# Patient Record
Sex: Female | Born: 1985 | Race: Black or African American | Hispanic: No | Marital: Single | Smoking: Current every day smoker
Health system: Southern US, Community
[De-identification: ages and names within clinical notes are randomized; demographics above are authoritative.]

## PROBLEM LIST (undated history)

## (undated) DIAGNOSIS — R569 Unspecified convulsions: Secondary | ICD-10-CM

## (undated) HISTORY — DX: Unspecified convulsions: R56.9

---

## 2007-08-08 ENCOUNTER — Emergency Department: Payer: Self-pay | Admitting: Emergency Medicine

## 2008-01-03 ENCOUNTER — Emergency Department: Payer: Self-pay | Admitting: Emergency Medicine

## 2009-06-03 ENCOUNTER — Emergency Department: Payer: Self-pay | Admitting: Emergency Medicine

## 2014-08-07 ENCOUNTER — Emergency Department: Payer: Self-pay | Admitting: Emergency Medicine

## 2015-05-07 ENCOUNTER — Emergency Department
Admission: EM | Admit: 2015-05-07 | Discharge: 2015-05-07 | Disposition: A | Discharge status: Home / Self Care | Payer: Self-pay | Attending: Emergency Medicine | Admitting: Emergency Medicine

## 2015-05-07 ENCOUNTER — Emergency Department: Payer: Self-pay

## 2015-05-07 ENCOUNTER — Encounter: Payer: Self-pay | Admitting: *Deleted

## 2015-05-07 DIAGNOSIS — F17200 Nicotine dependence, unspecified, uncomplicated: Secondary | ICD-10-CM | POA: Insufficient documentation

## 2015-05-07 DIAGNOSIS — J189 Pneumonia, unspecified organism: Secondary | ICD-10-CM

## 2015-05-07 DIAGNOSIS — J159 Unspecified bacterial pneumonia: Secondary | ICD-10-CM | POA: Insufficient documentation

## 2015-05-07 MED ORDER — AZITHROMYCIN 250 MG PO TABS
500.0000 mg | ORAL_TABLET | Freq: Once | ORAL | Status: AC
  Administered 2015-05-07: 500 mg via ORAL
  Filled 2015-05-07: qty 2

## 2015-05-07 MED ORDER — PSEUDOEPH-BROMPHEN-DM 30-2-10 MG/5ML PO SYRP: 5.0000 mL | ORAL_SOLUTION | Freq: Four times a day (QID) | ORAL | Status: DC | PRN

## 2015-05-07 MED ORDER — ALBUTEROL SULFATE HFA 108 (90 BASE) MCG/ACT IN AERS: 2.0000 | INHALATION_SPRAY | Freq: Four times a day (QID) | RESPIRATORY_TRACT | Status: DC | PRN

## 2015-05-07 MED ORDER — BENZONATATE 100 MG PO CAPS: 100.0000 mg | ORAL_CAPSULE | Freq: Three times a day (TID) | ORAL | Status: DC | PRN

## 2015-05-07 MED ORDER — CEFTRIAXONE SODIUM 1 G IJ SOLR
1.0000 g | INTRAMUSCULAR | Status: DC
  Administered 2015-05-07: 1 g via INTRAMUSCULAR
  Filled 2015-05-07: qty 10

## 2015-05-07 MED ORDER — IPRATROPIUM-ALBUTEROL 0.5-2.5 (3) MG/3ML IN SOLN
3.0000 mL | Freq: Once | RESPIRATORY_TRACT | Status: AC
  Administered 2015-05-07: 3 mL via RESPIRATORY_TRACT
  Filled 2015-05-07: qty 3

## 2015-05-07 MED ORDER — AZITHROMYCIN 250 MG PO TABS: ORAL_TABLET | ORAL | Status: DC

## 2015-05-07 NOTE — Discharge Instructions (Signed)
Community-Acquired Pneumonia, Adult °Pneumonia is an infection of the lungs. There are different types of pneumonia. One type can develop while a person is in a hospital. A different type, called community-acquired pneumonia, develops in people who are not, or have not recently been, in the hospital or other health care facility.  °CAUSES °Pneumonia may be caused by bacteria, viruses, or funguses. Community-acquired pneumonia is often caused by Streptococcus pneumonia bacteria. These bacteria are often passed from one person to another by breathing in droplets from the cough or sneeze of an infected person. °RISK FACTORS °The condition is more likely to develop in: °· People who have chronic diseases, such as chronic obstructive pulmonary disease (COPD), asthma, congestive heart failure, cystic fibrosis, diabetes, or kidney disease. °· People who have early-stage or late-stage HIV. °· People who have sickle cell disease. °· People who have had their spleen removed (splenectomy). °· People who have poor dental hygiene. °· People who have medical conditions that increase the risk of breathing in (aspirating) secretions their own mouth and nose.   °· People who have a weakened immune system (immunocompromised). °· People who smoke. °· People who travel to areas where pneumonia-causing germs commonly exist. °· People who are around animal habitats or animals that have pneumonia-causing germs, including birds, bats, rabbits, cats, and farm animals. °SYMPTOMS °Symptoms of this condition include: °· A dry cough. °· A wet (productive) cough. °· Fever. °· Sweating. °· Chest pain, especially when breathing deeply or coughing. °· Rapid breathing or difficulty breathing. °· Shortness of breath. °· Shaking chills. °· Fatigue. °· Muscle aches. °DIAGNOSIS °Your health care provider will take a medical history and perform a physical exam. You may also have other tests, including: °· Imaging studies of your chest, including  X-rays. °· Tests to check your blood oxygen level and other blood gases. °· Other tests on blood, mucus (sputum), fluid around your lungs (pleural fluid), and urine. °If your pneumonia is severe, other tests may be done to identify the specific cause of your illness. °TREATMENT °The type of treatment that you receive depends on many factors, such as the cause of your pneumonia, the medicines you take, and other medical conditions that you have. For most adults, treatment and recovery from pneumonia may occur at home. In some cases, treatment must happen in a hospital. Treatment may include: °· Antibiotic medicines, if the pneumonia was caused by bacteria. °· Antiviral medicines, if the pneumonia was caused by a virus. °· Medicines that are given by mouth or through an IV tube. °· Oxygen. °· Respiratory therapy. °Although rare, treating severe pneumonia may include: °· Mechanical ventilation. This is done if you are not breathing well on your own and you cannot maintain a safe blood oxygen level. °· Thoracentesis. This procedure removes fluid around one lung or both lungs to help you breathe better. °HOME CARE INSTRUCTIONS °· Take over-the-counter and prescription medicines only as told by your health care provider. °¨ Only take cough medicine if you are losing sleep. Understand that cough medicine can prevent your body's natural ability to remove mucus from your lungs. °¨ If you were prescribed an antibiotic medicine, take it as told by your health care provider. Do not stop taking the antibiotic even if you start to feel better. °· Sleep in a semi-upright position at night. Try sleeping in a reclining chair, or place a few pillows under your head. °· Do not use tobacco products, including cigarettes, chewing tobacco, and e-cigarettes. If you need help quitting, ask your health care provider. °· Drink enough water to keep your urine   clear or pale yellow. This will help to thin out mucus secretions in your  lungs. PREVENTION There are ways that you can decrease your risk of developing community-acquired pneumonia. Consider getting a pneumococcal vaccine if:  You are older than 27 years of age.  You are older than 27 years of age and are undergoing cancer treatment, have chronic lung disease, or have other medical conditions that affect your immune system. Ask your health care provider if this applies to you. There are different types and schedules of pneumococcal vaccines. Ask your health care provider which vaccination option is best for you. You may also prevent community-acquired pneumonia if you take these actions:  Get an influenza vaccine every year. Ask your health care provider which type of influenza vaccine is best for you.  Go to the dentist on a regular basis.  Wash your hands often. Use hand sanitizer if soap and water are not available. SEEK MEDICAL CARE IF:  You have a fever.  You are losing sleep because you cannot control your cough with cough medicine. SEEK IMMEDIATE MEDICAL CARE IF:  You have worsening shortness of breath.  You have increased chest pain.  Your sickness becomes worse, especially if you are an older adult or have a weakened immune system.  You cough up blood.   This information is not intended to replace advice given to you by your health care provider. Make sure you discuss any questions you have with your health care provider.   Document Released: 05/16/2005 Document Revised: 02/04/2015 Document Reviewed: 09/10/2014 Elsevier Interactive Patient Education 2016 [employer]  Dose the prescription meds as directed. Continue to drink and eat to prevent dehydration. Return to the ED for worsening symptoms as discussed.

## 2015-05-07 NOTE — ED Notes (Signed)
Pt states cold like symptoms since Saturday

## 2015-05-07 NOTE — ED Provider Notes (Signed)
[facility] Emergency Department Provider Note ____________________________________________  Time seen: 1305  I have reviewed the triage vital signs and the nursing notes.  HISTORY  Chief Complaint  Generalized Body Aches; Cough; and Nasal Congestion  HPI Ariel Huynh is a 29 y.o. female presents to the ED for evaluation of cold-like symptoms with onset since Saturday, 5 days prior to arrival. She notes the onset of fevers however last 2 days. She notes a Tmax of 102F on Tuesday. She has been dosing Robitussin, DayQuil, and NyQuil for her symptoms. She denies dosed any medications for symptom relief. She denies recent travel, sick contacts, or receiving a flu vaccine this season.Patient is tolerating food and fluids without difficulty.  History reviewed. No pertinent past medical history.  There are no active problems to display for this patient.  History reviewed. No pertinent past surgical history.  Current Outpatient Rx  Name  Route  Sig  Dispense  Refill  . albuterol (PROVENTIL HFA;VENTOLIN HFA) 108 (90 BASE) MCG/ACT inhaler   Inhalation   Inhale 2 puffs into the lungs every 6 (six) hours as needed for wheezing or shortness of breath.   1 Inhaler   0   . azithromycin (ZITHROMAX Z-PAK) 250 MG tablet      Take 1 tablet (250 mg) once daily on Days 2 through 5.   4 tablet   0   . benzonatate (TESSALON PERLES) 100 MG capsule   Oral   Take 1 capsule (100 mg total) by mouth 3 (three) times daily as needed for cough (Take 1-2 per dose).   30 capsule   0   . brompheniramine-pseudoephedrine-DM 30-2-10 MG/5ML syrup   Oral   Take 5 mLs by mouth 4 (four) times daily as needed.   120 mL   0     Allergies Review of patient's allergies indicates no known allergies.  History reviewed. No pertinent family history.  Social History Social History  Substance Use Topics  . Smoking status: Current Every Day Smoker  . Smokeless tobacco: None  .  Alcohol Use: No   Review of Systems  Constitutional: Reports for fever. Eyes: Negative for visual changes. ENT: Negative for sore throat. Cardiovascular: Negative for chest pain. Respiratory: Positive for shortness of breath and cough Gastrointestinal: Negative for abdominal pain, vomiting and diarrhea. Genitourinary: Negative for dysuria. Musculoskeletal: Negative for back pain. Skin: Negative for rash. Neurological: Negative for headaches, focal weakness or numbness. ____________________________________________  PHYSICAL EXAM:  VITAL SIGNS: ED Triage Vitals  Enc Vitals Group     BP 05/07/15 1147 152/97 mmHg     Pulse Rate 05/07/15 1147 109     Resp 05/07/15 1147 18     Temp 05/07/15 1147 99 F (37.2 C)     Temp Source 05/07/15 1147 Oral     SpO2 05/07/15 1147 97 %     Weight 05/07/15 1147 240 lb (108.863 kg)     Height 05/07/15 1147  (1.651 m)     Head Cir --      Peak Flow --      Pain Score 05/07/15 1148 6     Pain Loc --      Pain Edu? --      Excl. in GC? --    Constitutional: Alert and oriented. Well appearing and in no distress. Head: Normocephalic and atraumatic.      Eyes: Conjunctivae are normal. PERRL. Normal extraocular movements      Ears: Canals clear. TMs intact bilaterally.  Left TM injected and retracted.    Nose: No congestion/rhinorrhea.   Mouth/Throat: Mucous membranes are moist.   Neck: Supple. No thyromegaly. Hematological/Lymphatic/Immunological: No cervical lymphadenopathy. Cardiovascular: Normal rate, regular rhythm.  Respiratory: Normal respiratory effort. No wheezes/rales/rhonchi. Gastrointestinal: Soft and nontender. No distention. Musculoskeletal: Nontender with normal range of motion in all extremities.  Neurologic:  Normal gait without ataxia. Normal speech and language. No gross focal neurologic deficits are appreciated. Skin:  Skin is warm, dry and intact. No rash noted. Psychiatric: Mood and affect are normal. Patient  exhibits appropriate insight and judgment. ____________________________________________   RADIOLOGY CXR IMPRESSION: Lingular and left lower lobe infectious bronchiolitis/ bronchopneumonia. Possible early involvement of the right lung base. ____________________________________________  PROCEDURES  DuoNeb x 1 Azithromycin 500 mg PO Rocephin 1 g IM ____________________________________________  INITIAL IMPRESSION / ASSESSMENT AND PLAN / ED COURSE  Patient with early CAP. Treatment initiated in the ED. Patient discharged with Azithromycin, Tessalon Perles, Albuterol MDI, and Bromfed DM. She is advised to return to the ED for acutely worsening symptoms.  ____________________________________________  FINAL CLINICAL IMPRESSION(S) / ED DIAGNOSES  Final diagnoses:  CAP (community acquired pneumonia)      Lissa HoardJenise V Bacon Thaddius Manes, PA-C 05/07/15 1356  Jeanmarie PlantJames A McShane, MD 05/07/15 1526

## 2015-09-23 ENCOUNTER — Emergency Department
Admission: EM | Admit: 2015-09-23 | Discharge: 2015-09-24 | Disposition: A | Discharge status: Home / Self Care | Payer: Self-pay | Attending: Emergency Medicine | Admitting: Emergency Medicine

## 2015-09-23 DIAGNOSIS — R112 Nausea with vomiting, unspecified: Secondary | ICD-10-CM | POA: Insufficient documentation

## 2015-09-23 DIAGNOSIS — F17200 Nicotine dependence, unspecified, uncomplicated: Secondary | ICD-10-CM | POA: Insufficient documentation

## 2015-09-23 LAB — URINALYSIS COMPLETE WITH MICROSCOPIC (ARMC ONLY)
BILIRUBIN URINE: NEGATIVE
GLUCOSE, UA: NEGATIVE mg/dL
Nitrite: NEGATIVE
PH: 5 (ref 5.0–8.0)
Protein, ur: 100 mg/dL — AB
Specific Gravity, Urine: 1.017 (ref 1.005–1.030)

## 2015-09-23 LAB — CBC
HCT: 39.3 % (ref 35.0–47.0)
Hemoglobin: 13.2 g/dL (ref 12.0–16.0)
MCH: 25.8 pg — AB (ref 26.0–34.0)
MCHC: 33.4 g/dL (ref 32.0–36.0)
MCV: 77.2 fL — AB (ref 80.0–100.0)
PLATELETS: 289 10*3/uL (ref 150–440)
RBC: 5.09 MIL/uL (ref 3.80–5.20)
RDW: 14.8 % — ABNORMAL HIGH (ref 11.5–14.5)
WBC: 6.6 10*3/uL (ref 3.6–11.0)

## 2015-09-23 LAB — COMPREHENSIVE METABOLIC PANEL
ALK PHOS: 68 U/L (ref 38–126)
ALT: 12 U/L — AB (ref 14–54)
AST: 21 U/L (ref 15–41)
Albumin: 4.2 g/dL (ref 3.5–5.0)
Anion gap: 10 (ref 5–15)
BUN: 8 mg/dL (ref 6–20)
CALCIUM: 9.2 mg/dL (ref 8.9–10.3)
CHLORIDE: 108 mmol/L (ref 101–111)
CO2: 21 mmol/L — AB (ref 22–32)
CREATININE: 0.79 mg/dL (ref 0.44–1.00)
GFR calc Af Amer: >60 mL/min (ref >60)
GFR calc non Af Amer: >60 mL/min (ref >60)
GLUCOSE: 103 mg/dL — AB (ref 65–99)
Potassium: 3.4 mmol/L — ABNORMAL LOW (ref 3.5–5.1)
Sodium: 139 mmol/L (ref 135–145)
Total Bilirubin: 0.6 mg/dL (ref 0.3–1.2)
Total Protein: 7.7 g/dL (ref 6.5–8.1)

## 2015-09-23 LAB — POCT PREGNANCY, URINE: PREG TEST UR: NEGATIVE

## 2015-09-23 LAB — LIPASE, BLOOD: LIPASE: 36 U/L (ref 11–51)

## 2015-09-23 MED ORDER — ONDANSETRON HCL 4 MG/2ML IJ SOLN
4.0000 mg | Freq: Once | INTRAMUSCULAR | Status: AC
  Administered 2015-09-23: 4 mg via INTRAVENOUS
  Filled 2015-09-23: qty 2

## 2015-09-23 MED ORDER — SODIUM CHLORIDE 0.9 % IV BOLUS (SEPSIS)
1000.0000 mL | Freq: Once | INTRAVENOUS | Status: AC
  Administered 2015-09-24: 1000 mL via INTRAVENOUS

## 2015-09-23 MED ORDER — ONDANSETRON 4 MG PO TBDP
4.0000 mg | ORAL_TABLET | Freq: Once | ORAL | Status: AC | PRN
  Administered 2015-09-23: 4 mg via ORAL

## 2015-09-23 MED ORDER — SODIUM CHLORIDE 0.9 % IV BOLUS (SEPSIS)
1000.0000 mL | Freq: Once | INTRAVENOUS | Status: AC
Start: 2015-09-23 — End: 2015-09-24
  Administered 2015-09-23: 1000 mL via INTRAVENOUS

## 2015-09-23 MED ORDER — ONDANSETRON 4 MG PO TBDP
ORAL_TABLET | ORAL | Status: AC
  Filled 2015-09-23: qty 1

## 2015-09-23 NOTE — ED Notes (Signed)
Pt arrives to ER via POV, ambulatory c/o vomiting X 7 times today. Denies diarrhea. States vomiting began before abdominal pain. Pt alert and oriented X4, active, cooperative, pt in NAD. RR even and unlabored, color WNL.  Pt took peptobismol without relief.

## 2015-09-23 NOTE — ED Provider Notes (Signed)
[facility] Emergency Department Provider Note  ____________________________________________  Time seen: 7:05 PM  I have reviewed the triage vital signs and the nursing notes.   HISTORY  Chief Complaint Emesis      HPI Ariel Huynh is a 30 y.o. female presents with one-day history of nonbloody emesis approximately 7 times last episode approximately 10 minutes, per patient. Patient denies any diarrhea no vomiting or abdominal pain. Patient states symptoms started abruptly and is unrelieved with Pepto-Bismol at home. She denies any urinary symptoms.     Past medical history None There are no active problems to display for this patient.   Past surgical history None  Current Outpatient Rx  Name  Route  Sig  Dispense  Refill  . albuterol (PROVENTIL HFA;VENTOLIN HFA) 108 (90 BASE) MCG/ACT inhaler   Inhalation   Inhale 2 puffs into the lungs every 6 (six) hours as needed for wheezing or shortness of breath.   1 Inhaler   0   . azithromycin (ZITHROMAX Z-PAK) 250 MG tablet      Take 1 tablet (250 mg) once daily on Days 2 through 5.   4 tablet   0   . benzonatate (TESSALON PERLES) 100 MG capsule   Oral   Take 1 capsule (100 mg total) by mouth 3 (three) times daily as needed for cough (Take 1-2 per dose).   30 capsule   0   . brompheniramine-pseudoephedrine-DM 30-2-10 MG/5ML syrup   Oral   Take 5 mLs by mouth 4 (four) times daily as needed.   120 mL   0     Allergies No known drug allergies No family history on file.  Social History Social History  Substance Use Topics  . Smoking status: Current Every Day Smoker  . Smokeless tobacco: None  . Alcohol Use: No    Review of Systems  Constitutional: Negative for fever. Eyes: Negative for visual changes. ENT: Negative for sore throat. Cardiovascular: Negative for chest pain. Respiratory: Negative for shortness of breath. Gastrointestinal: Negative for abdominal pain,and  diarrhea. Positive for vomiting Genitourinary: Negative for dysuria. Musculoskeletal: Negative for back pain. Skin: Negative for rash. Neurological: Negative for headaches, focal weakness or numbness.  10-point ROS otherwise negative.  ____________________________________________   PHYSICAL EXAM:  VITAL SIGNS: ED Triage Vitals  Enc Vitals Group     BP 09/23/15 2130 129/73 mmHg     Pulse Rate 09/23/15 2130 86     Resp 09/23/15 2130 18     Temp 09/23/15 2130 98.7 F (37.1 C)     Temp Source 09/23/15 2130 Oral     SpO2 09/23/15 2130 98 %     Weight 09/23/15 2130 210 lb (95.255 kg)     Height 09/23/15 2130  (1.651 m)     Head Cir --      Peak Flow --      Pain Score --      Pain Loc --      Pain Edu? --      Excl. in GC? --      Constitutional: Alert and oriented. Well appearing and in no distress. Eyes: Conjunctivae are normal. PERRL. Normal extraocular movements. ENT   Head: Normocephalic and atraumatic.   Nose: No congestion/rhinnorhea.   Mouth/Throat: Dry mucous membranes   Neck: No stridor. Hematological/Lymphatic/Immunilogical: No cervical lymphadenopathy. Cardiovascular: Normal rate, regular rhythm. Normal and symmetric distal pulses are present in all extremities. No murmurs, rubs, or gallops. Respiratory: Normal respiratory effort without tachypnea  nor retractions. Breath sounds are clear and equal bilaterally. No wheezes/rales/rhonchi. Gastrointestinal: Soft and nontender. No distention. There is no CVA tenderness. Genitourinary: deferred Musculoskeletal: Nontender with normal range of motion in all extremities. No joint effusions.  No lower extremity tenderness nor edema. Neurologic:  Normal speech and language. No gross focal neurologic deficits are appreciated. Speech is normal.  Skin:  Skin is warm, dry and intact. No rash noted. Psychiatric: Mood and affect are normal. Speech and behavior are normal. Patient exhibits appropriate insight and  judgment.  ____________________________________________    LABS (pertinent positives/negatives)  Labs Reviewed  COMPREHENSIVE METABOLIC PANEL - Abnormal; Notable for the following:    Potassium 3.4 (*)    CO2 21 (*)    Glucose, Bld 103 (*)    ALT 12 (*)    All other components within normal limits  CBC - Abnormal; Notable for the following:    MCV 77.2 (*)    MCH 25.8 (*)    RDW 14.8 (*)    All other components within normal limits  URINALYSIS COMPLETEWITH MICROSCOPIC ([facility] ONLY) - Abnormal; Notable for the following:    Color, Urine YELLOW (*)    APPearance CLOUDY (*)    Ketones, ur 1+ (*)    Hgb urine dipstick 1+ (*)    Protein, ur 100 (*)    Leukocytes, UA 1+ (*)    Bacteria, UA RARE (*)    Squamous Epithelial / LPF 6-30 (*)    All other components within normal limits  LIPASE, BLOOD  POC URINE PREG, ED  POCT PREGNANCY, URINE           INITIAL IMPRESSION / ASSESSMENT AND PLAN / ED COURSE  Pertinent labs & imaging results that were available during my care of the patient were reviewed by me and considered in my medical decision making (see chart for details).  No pain with abdominal exam no CVA tenderness patient denied any urinary symptoms urinalysis not a clean catch urine and given absence of dysuria or urinary frequency or urgency considered UTI to be unlikely.  ____________________________________________   FINAL CLINICAL IMPRESSION(S) / ED DIAGNOSES  Final diagnoses:  Non-intractable vomiting with nausea, vomiting of unspecified type      Ariel Currentandolph N Brown, MD 09/24/15 0121

## 2015-09-24 MED ORDER — ONDANSETRON 4 MG PO TBDP
4.0000 mg | ORAL_TABLET | Freq: Three times a day (TID) | ORAL | Status: DC | PRN
Start: 2015-09-24 — End: 2020-09-02

## 2015-09-24 NOTE — ED Notes (Signed)
Pt reports improved nausea.  

## 2015-09-24 NOTE — Discharge Instructions (Signed)

## 2019-08-02 ENCOUNTER — Encounter: Payer: Self-pay | Admitting: Emergency Medicine

## 2019-08-02 ENCOUNTER — Emergency Department: Payer: No Typology Code available for payment source

## 2019-08-02 ENCOUNTER — Other Ambulatory Visit: Payer: Self-pay

## 2019-08-02 ENCOUNTER — Emergency Department
Admission: EM | Admit: 2019-08-02 | Discharge: 2019-08-02 | Disposition: A | Discharge status: Home / Self Care | Payer: No Typology Code available for payment source | Attending: Student in an Organized Health Care Education/Training Program | Admitting: Student in an Organized Health Care Education/Training Program

## 2019-08-02 DIAGNOSIS — F17200 Nicotine dependence, unspecified, uncomplicated: Secondary | ICD-10-CM | POA: Diagnosis not present

## 2019-08-02 DIAGNOSIS — R059 Cough, unspecified: Secondary | ICD-10-CM

## 2019-08-02 DIAGNOSIS — R05 Cough: Secondary | ICD-10-CM | POA: Insufficient documentation

## 2019-08-02 DIAGNOSIS — R030 Elevated blood-pressure reading, without diagnosis of hypertension: Secondary | ICD-10-CM | POA: Diagnosis not present

## 2019-08-02 LAB — COMPREHENSIVE METABOLIC PANEL
ALT: 13 U/L (ref 0–44)
AST: 18 U/L (ref 15–41)
Albumin: 4 g/dL (ref 3.5–5.0)
Alkaline Phosphatase: 63 U/L (ref 38–126)
Anion gap: 5 (ref 5–15)
BUN: 7 mg/dL (ref 6–20)
CO2: 24 mmol/L (ref 22–32)
Calcium: 9.2 mg/dL (ref 8.9–10.3)
Chloride: 107 mmol/L (ref 98–111)
Creatinine, Ser: 0.69 mg/dL (ref 0.44–1.00)
GFR calc Af Amer: >60 mL/min (ref >60)
GFR calc non Af Amer: >60 mL/min (ref >60)
Glucose, Bld: 89 mg/dL (ref 70–99)
Potassium: 4.1 mmol/L (ref 3.5–5.1)
Sodium: 136 mmol/L (ref 135–145)
Total Bilirubin: 0.4 mg/dL (ref 0.3–1.2)
Total Protein: 7.4 g/dL (ref 6.5–8.1)

## 2019-08-02 LAB — CBC WITH DIFFERENTIAL/PLATELET
Abs Immature Granulocytes: 0.03 10*3/uL (ref 0.00–0.07)
Basophils Absolute: 0.1 10*3/uL (ref 0.0–0.1)
Basophils Relative: 1 %
Eosinophils Absolute: 0.1 10*3/uL (ref 0.0–0.5)
Eosinophils Relative: 1 %
HCT: 39 % (ref 36.0–46.0)
Hemoglobin: 12.9 g/dL (ref 12.0–15.0)
Immature Granulocytes: 0 %
Lymphocytes Relative: 26 %
Lymphs Abs: 2.2 10*3/uL (ref 0.7–4.0)
MCH: 25.8 pg — ABNORMAL LOW (ref 26.0–34.0)
MCHC: 33.1 g/dL (ref 30.0–36.0)
MCV: 78 fL — ABNORMAL LOW (ref 80.0–100.0)
Monocytes Absolute: 0.7 10*3/uL (ref 0.1–1.0)
Monocytes Relative: 8 %
Neutro Abs: 5.5 10*3/uL (ref 1.7–7.7)
Neutrophils Relative %: 64 %
Platelets: 298 10*3/uL (ref 150–400)
RBC: 5 MIL/uL (ref 3.87–5.11)
RDW: 15 % (ref 11.5–15.5)
WBC: 8.5 10*3/uL (ref 4.0–10.5)
nRBC: 0 % (ref 0.0–0.2)

## 2019-08-02 LAB — POCT PREGNANCY, URINE: Preg Test, Ur: NEGATIVE

## 2019-08-02 LAB — TROPONIN I (HIGH SENSITIVITY): Troponin I (High Sensitivity): <2 ng/L (ref <18)

## 2019-08-02 MED ORDER — IOHEXOL 350 MG/ML SOLN
75.0000 mL | Freq: Once | INTRAVENOUS | Status: AC | PRN
  Administered 2019-08-02: 75 mL via INTRAVENOUS
  Filled 2019-08-02: qty 75

## 2019-08-02 NOTE — Discharge Instructions (Signed)
Follow-up with 1 the clinics listed on your discharge papers or you may also go to an urgent care to have your blood pressure rechecked in 2 weeks.  If it remains elevated you may need to be on blood pressure medication.  Increase fluids.  Avoid fumes and reduce smoking as much as possible.

## 2019-08-02 NOTE — ED Provider Notes (Signed)
[facility] Emergency Department Provider Note  ____________________________________________   First MD Initiated Contact with Patient 08/02/19 [phone]     (approximate)  I have reviewed the triage vital signs and the nursing notes.   HISTORY  Chief Complaint Cough and Shortness of Breath   HPI Ariel Huynh is a 34 y.o. female presents to the ED with complaint of cough and shortness of breath.  Patient states that she was cleaning Wednesday and is not sure if the fumes "got to her".  She denies any upper respiratory symptoms, fever, chills, nausea, vomiting or change in taste or smell.  She denies any exposure to Covid.  Patient states that she was short of breath when climbing steps.  She states that the coughing is intermittent and nonproductive.  Patient reports that she does smoke 4 to 5 cigarettes/day.  She denies any previous history of asthma or bronchitis.  She denies any injury to her legs or prolonged traveling.  Patient currently is not on any birth control.  She denies any pain.      History reviewed. No pertinent past medical history.  There are no problems to display for this patient.   History reviewed. No pertinent surgical history.  Prior to Admission medications   Medication Sig Start Date End Date Taking? Authorizing Provider  ondansetron (ZOFRAN ODT) 4 MG disintegrating tablet Take 1 tablet (4 mg total) by mouth every 8 (eight) hours as needed for nausea or vomiting. 09/24/15   Darci Current, MD    Allergies Patient has no known allergies.  History reviewed. No pertinent family history.  Social History Social History   Tobacco Use   Smoking status: Current Every Day Smoker   Smokeless tobacco: Never Used  Substance Use Topics   Alcohol use: No   Drug use: Never    Review of Systems Constitutional: No fever/chills ENT: No sore throat. Cardiovascular: Denies chest pain. Respiratory: Positive shortness of  breath. Gastrointestinal: No abdominal pain.  No nausea, no vomiting.  No diarrhea.  No constipation. Genitourinary: Negative for dysuria. Musculoskeletal: Negative for muscle aches. Skin: Negative for rash. Neurological: Negative for headaches, focal weakness or numbness. ____________________________________________   PHYSICAL EXAM:  VITAL SIGNS: ED Triage Vitals  Enc Vitals Group     BP 08/02/19 0901 (!) 151/100     Pulse Rate 08/02/19 0901 97     Resp 08/02/19 0901 16     Temp 08/02/19 0901 98.7 F (37.1 C)     Temp Source 08/02/19 0901 Oral     SpO2 08/02/19 0901 100 %     Weight 08/02/19 0900 245 lb (111.1 kg)     Height 08/02/19 0900 5\' 5"  (1.651 m)     Head Circumference --      Peak Flow --      Pain Score 08/02/19 0900 0     Pain Loc --      Pain Edu? --      Excl. in GC? --    Constitutional: Alert and oriented. Well appearing and in no acute distress. Eyes: Conjunctivae are normal.  Head: Atraumatic. Nose: No congestion/rhinnorhea. Neck: No stridor.   Cardiovascular: Rapid rate, regular rhythm. Grossly normal heart sounds.  Good peripheral circulation. Respiratory: Normal respiratory effort.  No retractions. Lungs CTAB. Gastrointestinal: Soft and nontender. No distention.  Musculoskeletal: Moves upper and lower extremities any difficulty normal gait was noted. Neurologic:  Normal speech and language. No gross focal neurologic deficits are appreciated. No gait instability.  Skin:  Skin is warm, dry and intact.  Psychiatric: Mood and affect are normal. Speech and behavior are normal.  ____________________________________________   LABS (all labs ordered are listed, but only abnormal results are displayed)  Labs Reviewed  CBC WITH DIFFERENTIAL/PLATELET - Abnormal; Notable for the following components:      Result Value   MCV 78.0 (*)    MCH 25.8 (*)    All other components within normal limits  COMPREHENSIVE METABOLIC PANEL  POC URINE PREG, ED  POCT  PREGNANCY, URINE  TROPONIN I (HIGH SENSITIVITY)   ____________________________________________  EKG  EKG was reviewed by Dr. Corky Downs. Normal sinus rhythm with ventricular rate of 90, PR interval 136, QRS duration 74. ____________________________________________  RADIOLOGY   Official radiology report(s): DG Chest 2 View  Result Date: 08/02/2019 CLINICAL DATA:  Cough, inhaled fumes from cleaning products 2 days ago EXAM: CHEST - 2 VIEW COMPARISON:  05/07/2015 FINDINGS: The heart size and mediastinal contours are within normal limits. Both lungs are clear. The visualized skeletal structures are unremarkable. IMPRESSION: No acute abnormality of the lungs. Electronically Signed   By: Eddie Candle M.D.   On: 08/02/2019 09:45   CT Angio Chest PE W and/or Wo Contrast  Result Date: 08/02/2019 CLINICAL DATA:  Shortness of breath and cough for 2 days. Smoker. Clinical concern for pulmonary embolism. EXAM: CT ANGIOGRAPHY CHEST WITH CONTRAST TECHNIQUE: Multidetector CT imaging of the chest was performed using the standard protocol during bolus administration of intravenous contrast. Multiplanar CT image reconstructions and MIPs were obtained to evaluate the vascular anatomy. CONTRAST:  21mL OMNIPAQUE IOHEXOL 350 MG/ML SOLN COMPARISON:  Radiographs 08/02/2019 and 05/07/2015. FINDINGS: Cardiovascular: The pulmonary arteries are well opacified with contrast to the level of the subsegmental branches. There is no evidence of acute pulmonary embolism. No significant systemic arterial abnormalities allowing for cardiac pulsation artifact. The heart size is normal. There is no pericardial effusion. Mediastinum/Nodes: There are no enlarged mediastinal, hilar or axillary lymph nodes.Small amount of residual thymic tissue. The thyroid gland, trachea and esophagus demonstrate no significant findings. Lungs/Pleura: No pleural effusion or pneumothorax. Subsegmental atelectasis within the right middle lobe and lingula. Mild  mosaic attenuation within the dependent portions of the lungs, probably due to atelectasis, edema or inflammation. No confluent airspace opacity, endobronchial lesion or suspicious pulmonary nodularity. Upper abdomen: Unremarkable.  There is no adrenal mass. Musculoskeletal/Chest wall: There is no chest wall mass or suspicious osseous finding. Review of the MIP images confirms the above findings. IMPRESSION: 1. No evidence of acute pulmonary embolism or other acute vascular process. 2. Subsegmental atelectasis in the right middle lobe and lingula. 3. Mild mosaic attenuation in the dependent portions of the lungs, probably due to atelectasis, edema or inflammation. No consolidation or significant pleural effusion. Electronically Signed   By: Richardean Sale M.D.   On: 08/02/2019 11:38    ____________________________________________   PROCEDURES  Procedure(s) performed (including Critical Care):  Procedures   ____________________________________________   INITIAL IMPRESSION / ASSESSMENT AND PLAN / ED COURSE  As part of my medical decision making, I reviewed the following data within the electronic MEDICAL RECORD NUMBER Notes from prior ED visits and [place] Controlled Substance Database  Ariel Huynh was evaluated in Emergency Department on 08/02/2019 for the symptoms described in the history of present illness. She was evaluated in the context of the global COVID-19 pandemic, which necessitated consideration that the patient might be at risk for infection with the SARS-CoV-2 virus that causes COVID-19. Institutional protocols and  algorithms that pertain to the evaluation of patients at risk for COVID-19 are in a state of rapid change based on information released by regulatory bodies including the CDC and federal and state organizations. These policies and algorithms were followed during the patient's care in the ED.   34 year old female presents to the ED with complaint of shortness of breath for the  last 2 days.  She attributes it to cleaning and being overcome by the fumes of the cleaning products.  She has had intermittent coughing that has been nonproductive.  She denies any fever, chills, nausea or vomiting.  She denies any exposure to Covid.  Patient is a smoker and was at low risk for a PE.  However patient was tachycardic and further test were obtained.  Patient had an elevated blood pressure initially on arrival.  Further testing was done and lab work was within normal limits and CT scan did not show a PE.  Chest x-ray was negative.  Patient is encouraged to have her blood pressure rechecked and if remains elevated she should be treated for hypertension.  She will also increase fluids and because the cough is intermittent and does not keep her up at night no medications was written.  Patient was reassured.  She is also to establish a PCP with one of the clinics listed on her discharge papers.    ____________________________________________   FINAL CLINICAL IMPRESSION(S) / ED DIAGNOSES  Final diagnoses:  Cough  Elevated blood pressure reading     ED Discharge Orders    None       Note:  This document was prepared using Dragon voice recognition software and may include unintentional dictation errors.    Bora, Bost, PA-C 08/02/19 1233    Willy Eddy, MD 08/02/19 [phone]

## 2019-08-02 NOTE — ED Triage Notes (Signed)
Pt reports was cleaning Wednesday and not sure if fumes got to her but has had coughing fits and trouble taking deep breath. No increased WOB noted.  Unlabored, VSS. No fever, non productive cough. Remained unlabored after ambulation.

## 2020-09-02 ENCOUNTER — Emergency Department
Admission: EM | Admit: 2020-09-02 | Discharge: 2020-09-02 | Disposition: A | Discharge status: Home / Self Care | Payer: Self-pay | Attending: Emergency Medicine | Admitting: Emergency Medicine

## 2020-09-02 ENCOUNTER — Emergency Department: Payer: Self-pay

## 2020-09-02 ENCOUNTER — Other Ambulatory Visit: Payer: Self-pay

## 2020-09-02 DIAGNOSIS — S0003XA Contusion of scalp, initial encounter: Secondary | ICD-10-CM | POA: Insufficient documentation

## 2020-09-02 DIAGNOSIS — R682 Dry mouth, unspecified: Secondary | ICD-10-CM | POA: Insufficient documentation

## 2020-09-02 DIAGNOSIS — R569 Unspecified convulsions: Secondary | ICD-10-CM | POA: Insufficient documentation

## 2020-09-02 DIAGNOSIS — Y9269 Other specified industrial and construction area as the place of occurrence of the external cause: Secondary | ICD-10-CM | POA: Insufficient documentation

## 2020-09-02 DIAGNOSIS — W01198A Fall on same level from slipping, tripping and stumbling with subsequent striking against other object, initial encounter: Secondary | ICD-10-CM | POA: Insufficient documentation

## 2020-09-02 DIAGNOSIS — S40211A Abrasion of right shoulder, initial encounter: Secondary | ICD-10-CM | POA: Insufficient documentation

## 2020-09-02 DIAGNOSIS — F17210 Nicotine dependence, cigarettes, uncomplicated: Secondary | ICD-10-CM | POA: Insufficient documentation

## 2020-09-02 DIAGNOSIS — R000 Tachycardia, unspecified: Secondary | ICD-10-CM | POA: Insufficient documentation

## 2020-09-02 DIAGNOSIS — W19XXXA Unspecified fall, initial encounter: Secondary | ICD-10-CM

## 2020-09-02 DIAGNOSIS — Z20822 Contact with and (suspected) exposure to covid-19: Secondary | ICD-10-CM | POA: Insufficient documentation

## 2020-09-02 DIAGNOSIS — Y990 Civilian activity done for income or pay: Secondary | ICD-10-CM | POA: Insufficient documentation

## 2020-09-02 DIAGNOSIS — S60419A Abrasion of unspecified finger, initial encounter: Secondary | ICD-10-CM | POA: Insufficient documentation

## 2020-09-02 DIAGNOSIS — Z23 Encounter for immunization: Secondary | ICD-10-CM | POA: Insufficient documentation

## 2020-09-02 LAB — COMPREHENSIVE METABOLIC PANEL
ALT: 16 U/L (ref 0–44)
AST: 28 U/L (ref 15–41)
Albumin: 3.7 g/dL (ref 3.5–5.0)
Alkaline Phosphatase: 64 U/L (ref 38–126)
Anion gap: 7 (ref 5–15)
BUN: 10 mg/dL (ref 6–20)
CO2: 20 mmol/L — ABNORMAL LOW (ref 22–32)
Calcium: 8.8 mg/dL — ABNORMAL LOW (ref 8.9–10.3)
Chloride: 107 mmol/L (ref 98–111)
Creatinine, Ser: 0.97 mg/dL (ref 0.44–1.00)
GFR, Estimated: >60 mL/min (ref >60)
Glucose, Bld: 113 mg/dL — ABNORMAL HIGH (ref 70–99)
Potassium: 3.4 mmol/L — ABNORMAL LOW (ref 3.5–5.1)
Sodium: 134 mmol/L — ABNORMAL LOW (ref 135–145)
Total Bilirubin: 0.4 mg/dL (ref 0.3–1.2)
Total Protein: 7.3 g/dL (ref 6.5–8.1)

## 2020-09-02 LAB — CBC WITH DIFFERENTIAL/PLATELET
Abs Immature Granulocytes: 0.04 10*3/uL (ref 0.00–0.07)
Basophils Absolute: 0.1 10*3/uL (ref 0.0–0.1)
Basophils Relative: 1 %
Eosinophils Absolute: 0.1 10*3/uL (ref 0.0–0.5)
Eosinophils Relative: 1 %
HCT: 36.1 % (ref 36.0–46.0)
Hemoglobin: 12.1 g/dL (ref 12.0–15.0)
Immature Granulocytes: 0 %
Lymphocytes Relative: 23 %
Lymphs Abs: 2.1 10*3/uL (ref 0.7–4.0)
MCH: 26.2 pg (ref 26.0–34.0)
MCHC: 33.5 g/dL (ref 30.0–36.0)
MCV: 78.1 fL — ABNORMAL LOW (ref 80.0–100.0)
Monocytes Absolute: 0.7 10*3/uL (ref 0.1–1.0)
Monocytes Relative: 8 %
Neutro Abs: 6.2 10*3/uL (ref 1.7–7.7)
Neutrophils Relative %: 67 %
Platelets: 314 10*3/uL (ref 150–400)
RBC: 4.62 MIL/uL (ref 3.87–5.11)
RDW: 14.5 % (ref 11.5–15.5)
WBC: 9.2 10*3/uL (ref 4.0–10.5)
nRBC: 0 % (ref 0.0–0.2)

## 2020-09-02 LAB — RESP PANEL BY RT-PCR (FLU A&B, COVID) ARPGX2
Influenza A by PCR: NEGATIVE
Influenza B by PCR: NEGATIVE
SARS Coronavirus 2 by RT PCR: NEGATIVE

## 2020-09-02 LAB — HCG, QUANTITATIVE, PREGNANCY: hCG, Beta Chain, Quant, S: 1 m[IU]/mL (ref <5)

## 2020-09-02 MED ORDER — LORAZEPAM 2 MG/ML IJ SOLN
1.0000 mg | Freq: Once | INTRAMUSCULAR | Status: AC
  Administered 2020-09-02: 1 mg via INTRAVENOUS
  Filled 2020-09-02: qty 1

## 2020-09-02 MED ORDER — LACTATED RINGERS IV BOLUS
1000.0000 mL | Freq: Once | INTRAVENOUS | Status: AC
  Administered 2020-09-02: 1000 mL via INTRAVENOUS

## 2020-09-02 MED ORDER — TETANUS-DIPHTH-ACELL PERTUSSIS 5-2.5-18.5 LF-MCG/0.5 IM SUSY
0.5000 mL | PREFILLED_SYRINGE | Freq: Once | INTRAMUSCULAR | Status: AC
  Administered 2020-09-02: 0.5 mL via INTRAMUSCULAR
  Filled 2020-09-02: qty 0.5

## 2020-09-02 MED ORDER — POTASSIUM CHLORIDE CRYS ER 20 MEQ PO TBCR
40.0000 meq | EXTENDED_RELEASE_TABLET | Freq: Once | ORAL | Status: AC
  Administered 2020-09-02: 40 meq via ORAL
  Filled 2020-09-02: qty 2

## 2020-09-02 MED ORDER — ONDANSETRON HCL 4 MG PO TABS: 4.0000 mg | ORAL_TABLET | Freq: Three times a day (TID) | ORAL | 0 refills | Status: AC | PRN

## 2020-09-02 MED ORDER — KETOROLAC TROMETHAMINE 30 MG/ML IJ SOLN
15.0000 mg | Freq: Once | INTRAMUSCULAR | Status: AC
  Administered 2020-09-02: 15 mg via INTRAVENOUS
  Filled 2020-09-02: qty 1

## 2020-09-02 MED ORDER — ACETAMINOPHEN 500 MG PO TABS
1000.0000 mg | ORAL_TABLET | Freq: Once | ORAL | Status: AC
  Administered 2020-09-02: 1000 mg via ORAL
  Filled 2020-09-02: qty 2

## 2020-09-02 MED ORDER — MORPHINE SULFATE (PF) 4 MG/ML IV SOLN
4.0000 mg | Freq: Once | INTRAVENOUS | Status: AC
  Administered 2020-09-02: 4 mg via INTRAVENOUS
  Filled 2020-09-02: qty 1

## 2020-09-02 MED ORDER — ONDANSETRON 4 MG PO TBDP
4.0000 mg | ORAL_TABLET | Freq: Once | ORAL | Status: AC
  Administered 2020-09-02: 4 mg via ORAL
  Filled 2020-09-02: qty 1

## 2020-09-02 NOTE — ED Provider Notes (Signed)
[facility] Emergency Department Provider Note  ____________________________________________   Event Date/Time   First MD Initiated Contact with Patient 09/02/20 1514     (approximate)  I have reviewed the triage vital signs and the nursing notes.   HISTORY  Chief Complaint Seizures (Seizure vs syncope)   HPI Ariel Huynh is a 35 y.o. female without significant past medical history who presents via EMS from [employer] where she is employee after she had a seizure versus syncopal episode causing her to fall onto her face.  Patient states he does not recall this but does remember waking up with EMS around her.  Per EMS he spoke with wife and nurse patient had some twitching before falling but is unclear on his last.  She states she is otherwise in her usual state health without any recent fevers, chills, cough, nausea, vomiting, diarrhea, dysuria, rash, chest pain, or any other acute sick symptoms.  She endorses tobacco abuse but denies EtOH or illicit drug use.  No history of seizures family history of seizures she is aware of.  She states been sleeping normally and drinks caffeine intermittently but no significant amount today.         No past medical history on file.  There are no problems to display for this patient.   No past surgical history on file.  Prior to Admission medications   Medication Sig Start Date End Date Taking? Authorizing Provider  ondansetron (ZOFRAN ODT) 4 MG disintegrating tablet Take 1 tablet (4 mg total) by mouth every 8 (eight) hours as needed for nausea or vomiting. 09/24/15   Darci Current, MD    Allergies Patient has no known allergies.  No family history on file.  Social History Social History   Tobacco Use  . Smoking status: Current Every Day Smoker    Packs/day: 0.50    Types: Cigarettes  . Smokeless tobacco: Never Used  Substance Use Topics  . Alcohol use: No  . Drug use: Never    Review of  Systems  Review of Systems  Constitutional: Negative for chills and fever.  HENT: Negative for sore throat.   Eyes: Negative for pain.  Respiratory: Negative for cough and stridor.   Cardiovascular: Negative for chest pain.  Gastrointestinal: Negative for vomiting.  Musculoskeletal: Positive for myalgias ( R shoulder, L hand).  Skin: Negative for rash.  Neurological: Positive for loss of consciousness and headaches. Negative for seizures.  Psychiatric/Behavioral: Negative for suicidal ideas.  All other systems reviewed and are negative.     ____________________________________________   PHYSICAL EXAM:  VITAL SIGNS: ED Triage Vitals  Enc Vitals Group     BP      Pulse      Resp      Temp      Temp src      SpO2      Weight      Height      Head Circumference      Peak Flow      Pain Score      Pain Loc      Pain Edu?      Excl. in GC?    Vitals:   09/02/20 1620 09/02/20 1748  BP: 130/85 133/86  Pulse: 95 86  Resp: 14 17  Temp:    SpO2: 100% 100%   Physical Exam Vitals and nursing note reviewed.  Constitutional:      General: She is not in acute distress.  Appearance: She is well-developed.  HENT:     Head: Normocephalic.     Right Ear: External ear normal.     Left Ear: External ear normal.     Nose: Nose normal.     Mouth/Throat:     Mouth: Mucous membranes are dry.  Eyes:     Conjunctiva/sclera: Conjunctivae normal.  Cardiovascular:     Rate and Rhythm: Regular rhythm. Tachycardia present.     Heart sounds: No murmur heard.   Pulmonary:     Effort: Pulmonary effort is normal. No respiratory distress.     Breath sounds: Normal breath sounds.  Abdominal:     Palpations: Abdomen is soft.     Tenderness: There is no abdominal tenderness. There is no right CVA tenderness or left CVA tenderness.  Musculoskeletal:     Cervical back: Neck supple.  Skin:    General: Skin is warm and dry.     Capillary Refill: Capillary refill takes less than 2  seconds.  Neurological:     Mental Status: She is alert and oriented to person, place, and time.     Cranial nerves II through XII grossly intact.  No pronator drift.  No finger dysmetria.  Symmetric 5/5 strength of all extremities.  Sensation intact to light touch in all extremities.  Unremarkable unassisted gait.  Patient has no step-offs tenderness or deformities over her C/T/L-spine.  2+ bilateral radial and DP pulses.  There is an abrasion over her right forehead with underlying hematoma but no underlying palpable skull fracture.  No other obvious trauma to the face scalp head or neck and oropharynx is unremarkable.  Patient has an abrasion over her anterior right shoulder and goes over the superior aspect but otherwise no deformity or limitation range of motion.  She also has an abrasion scattered over her left hand but she is able to flex and extend all digits in the left hand and has sensation intact in the distribution of the ulnar radial and median nerves.  She otherwise has full strength and sensation throughout all extremities and there is no obvious deformity to effusion or overlying evidence of trauma to the elbows, wrists, hips, knees or ankles.  No snuffbox tenderness.  ____________________________________________   LABS (all labs ordered are listed, but only abnormal results are displayed)  Labs Reviewed  CBC WITH DIFFERENTIAL/PLATELET - Abnormal; Notable for the following components:      Result Value   MCV 78.1 (*)    All other components within normal limits  COMPREHENSIVE METABOLIC PANEL - Abnormal; Notable for the following components:   Sodium 134 (*)    Potassium 3.4 (*)    CO2 20 (*)    Glucose, Bld 113 (*)    Calcium 8.8 (*)    All other components within normal limits  RESP PANEL BY RT-PCR (FLU A&B, COVID) ARPGX2  HCG, QUANTITATIVE, PREGNANCY   ____________________________________________  EKG  Sinus tachycardia with ventricular rate of 110, normal axis,  unremarkable intervals and no clear evidence of acute ischemia or other significant underlying arrhythmia.  ____________________________________________  RADIOLOGY  ED MD interpretation: CT head spine showed no evidence of acute injury aside from the frontal scalp, or other clear acute intracranial process.  Chest x-ray is unremarkable.  Left hand plain films unremarkable.   Official radiology report(s): DG Chest 2 View  Result Date: 09/02/2020 CLINICAL DATA:  Syncope versus seizure.  Fall. EXAM: CHEST - 2 VIEW COMPARISON:  08/02/2019 FINDINGS: Prominent heart size. Negative for edema or  effusion. Question mild right perihilar airspace disease. Left lung clear. IMPRESSION: Question mild right perihilar airspace disease. Otherwise lungs are clear. Electronically Signed   By: Marlan Palau M.D.   On: 09/02/2020 16:04   CT Head Wo Contrast  Result Date: 09/02/2020 CLINICAL DATA:  35 year old with facial trauma.  Post seizure. EXAM: CT HEAD WITHOUT CONTRAST CT CERVICAL SPINE WITHOUT CONTRAST TECHNIQUE: Multidetector CT imaging of the head and cervical spine was performed following the standard protocol without intravenous contrast. Multiplanar CT image reconstructions of the cervical spine were also generated. COMPARISON:  None. FINDINGS: CT HEAD FINDINGS Brain: No evidence of acute infarction, hemorrhage, hydrocephalus, extra-axial collection or mass lesion/mass effect. Vascular: No hyperdense vessel or unexpected calcification. Skull: Normal. Negative for fracture or focal lesion. Sinuses/Orbits: No acute finding. Other: Focal anterior swelling in the forehead. No underlying fracture. CT CERVICAL SPINE FINDINGS Alignment: Mild kyphosis in the cervical spine. Skull base and vertebrae: No acute fracture. No primary bone lesion or focal pathologic process. Soft tissues and spinal canal: No prevertebral fluid or swelling. No visible canal hematoma. Disc levels:  Disc spaces are maintained. Upper chest:  Negative. Other: Bone detail is limited in the lower cervical spine. IMPRESSION: 1. No acute intracranial abnormality. 2. No acute abnormality in the cervical spine. 3. Focal soft tissue swelling in the forehead without acute bone abnormality. Electronically Signed   By: Richarda Overlie M.D.   On: 09/02/2020 16:07   CT Cervical Spine Wo Contrast  Result Date: 09/02/2020 CLINICAL DATA:  Fall.  Head injury. EXAM: CT CERVICAL SPINE WITHOUT CONTRAST TECHNIQUE: Multidetector CT imaging of the cervical spine was performed without intravenous contrast. Multiplanar CT image reconstructions were also generated. COMPARISON:  None. FINDINGS: Alignment: Normal alignment.  Mild cervical kyphosis Skull base and vertebrae: Negative for fracture Soft tissues and spinal canal: Negative Disc levels: Normal disc spaces. No significant degenerative change. Upper chest: Lung apices clear bilaterally Other: None IMPRESSION: Negative CT cervical spine. Electronically Signed   By: Marlan Palau M.D.   On: 09/02/2020 16:03   DG Hand Complete Left  Result Date: 09/02/2020 CLINICAL DATA:  Fall. EXAM: LEFT HAND - COMPLETE 3+ VIEW COMPARISON:  None. FINDINGS: There is no evidence of fracture or dislocation. There is no evidence of arthropathy or other focal bone abnormality. Soft tissues are unremarkable. IMPRESSION: Negative. Electronically Signed   By: Marlan Palau M.D.   On: 09/02/2020 16:05    ____________________________________________   PROCEDURES  Procedure(s) performed (including Critical Care):  .1-3 Lead EKG Interpretation Performed by: Gilles Chiquito, MD Authorized by: Gilles Chiquito, MD     Interpretation: normal     ECG rate assessment: normal     Rhythm: sinus rhythm     Ectopy: none     Conduction: normal       ____________________________________________   INITIAL IMPRESSION / ASSESSMENT AND PLAN / ED COURSE     Patient presents with above-stated history exam for assessment after a witnessed  syncopal preseizure episode causing patient to fall to her face.  On arrival she is afebrile and hemodynamically stable although she is little tachycardic at in the 120s.  On exam she has abrasion hematoma on her forehead and very small left-sided tongue lack as well as abrasion over her right shoulder and some scabbed wounds of her left hand.  She is otherwise neurovascular intact in all extremities and has no neuro deficits or active seizures on exam.  She denies any history of seizures or other recent  sick symptoms.  Differential includes seizure versus syncope precipitating the fall.  With regard to her trauma work-up impression is abrasions to her shoulder left hand plain films show no fracture dislocation and CT head and C-spine showed no intracranial hemorrhage skull fracture or acute C-spine injury.  Patient has no evidence of pneumothorax or rib fracture on chest x-ray.  Low suspicion for significant occult visceral injury or occult orthopedic injury at this time.  With regard to her syncope versus seizure ECG shows no evidence of arrhythmia or acute ischemia.  CBC shows no acute anemia or leukocytosis and CMP shows no clear electrolyte or metabolic derangements that would explain patient's presentation today.  Pregnancy test is negative.  Given patient seemed fairly fatigued did not recall any events immediately before or after she apparently lost consciousness at the time of exam are concerning for possible first-time seizure.  Discussed this with patient recommendation for close outpatient neurology follow-up she will likely undergo EEG.  Advised usual seizure precautions including avoiding driving, swimming, doing other activities that could potentially endanger herself or others if she were to have a seizure.  She did have some nausea and vomiting although states that she also for little unwell before the seizure is certainly possible to have picked up possible infectious gastroenteritis.  She  was able to tolerate p.o. and states he felt much better after blood noted antiemetics.  Given stable vitals with otherwise reassuring exam and work-up I believe she is safe for discharge with outpatient neurology follow-up.  Discharged stable condition.  Strict return cautions advised and discussed   ____________________________________________   FINAL CLINICAL IMPRESSION(S) / ED DIAGNOSES  Final diagnoses:  Seizure (HCC)  Hematoma of scalp, initial encounter  Fall, initial encounter  Abrasion of right shoulder, initial encounter  Abrasion of finger of left hand, initial encounter    Medications  Tdap (BOOSTRIX) injection 0.5 mL (0.5 mLs Intramuscular Given 09/02/20 1605)  acetaminophen (TYLENOL) tablet 1,000 mg (1,000 mg Oral Given 09/02/20 1605)  morphine 4 MG/ML injection 4 mg (4 mg Intravenous Given 09/02/20 1604)  lactated ringers bolus 1,000 mL (1,000 mLs Intravenous New Bag/Given 09/02/20 1606)  ondansetron (ZOFRAN-ODT) disintegrating tablet 4 mg (4 mg Oral Given 09/02/20 1657)  ketorolac (TORADOL) 30 MG/ML injection 15 mg (15 mg Intravenous Given 09/02/20 1742)  potassium chloride SA (KLOR-CON) CR tablet 40 mEq (40 mEq Oral Given 09/02/20 1742)  LORazepam (ATIVAN) injection 1 mg (1 mg Intravenous Given 09/02/20 1741)     ED Discharge Orders    None       Note:  This document was prepared using Dragon voice recognition software and may include unintentional dictation errors.   Gilles ChiquitoSmith, Zarinah Oviatt P, MD 09/02/20 [phone]

## 2020-09-02 NOTE — ED Triage Notes (Signed)
Pt arrives by EMS post seizure vs syncope while at work. Pt was outside, then per bystanders, began having twitching in the left side of her face, fell forward - striking her head. On EMS arrival, they report pt appeared to be postictal and slowly came around with more time. Pt is A&O x3 on arrival to ED. Does not remember much prior to event. No hx of seizures or similar event. No incontinence. Did bite left side of her tongue. Does have hematoma to central forehead. Denies any CP, SOB, abd pain or n/v/d.

## 2020-09-02 NOTE — ED Notes (Signed)
Pt c/o nausea, doctor notified and orders for zofran placed. Pt states her pain is much better. Will medicate per orders and continue to monitor

## 2022-01-17 ENCOUNTER — Emergency Department
Admission: EM | Admit: 2022-01-17 | Discharge: 2022-01-17 | Disposition: A | Discharge status: Home / Self Care | Payer: No Typology Code available for payment source | Attending: Emergency Medicine | Admitting: Emergency Medicine

## 2022-01-17 ENCOUNTER — Other Ambulatory Visit: Payer: Self-pay

## 2022-01-17 ENCOUNTER — Emergency Department: Payer: No Typology Code available for payment source

## 2022-01-17 DIAGNOSIS — R110 Nausea: Secondary | ICD-10-CM | POA: Insufficient documentation

## 2022-01-17 DIAGNOSIS — R55 Syncope and collapse: Secondary | ICD-10-CM | POA: Insufficient documentation

## 2022-01-17 LAB — CBG MONITORING, ED: Glucose-Capillary: 150 mg/dL — ABNORMAL HIGH (ref 70–99)

## 2022-01-17 LAB — D-DIMER, QUANTITATIVE: D-Dimer, Quant: 0.31 ug/mL-FEU (ref 0.00–0.50)

## 2022-01-17 LAB — HCG, QUANTITATIVE, PREGNANCY: hCG, Beta Chain, Quant, S: 1 m[IU]/mL (ref <5)

## 2022-01-17 LAB — CBC WITH DIFFERENTIAL/PLATELET
Abs Immature Granulocytes: 0.02 10*3/uL (ref 0.00–0.07)
Basophils Absolute: 0.1 10*3/uL (ref 0.0–0.1)
Basophils Relative: 1 %
Eosinophils Absolute: 0.3 10*3/uL (ref 0.0–0.5)
Eosinophils Relative: 3 %
HCT: 39.7 % (ref 36.0–46.0)
Hemoglobin: 13 g/dL (ref 12.0–15.0)
Immature Granulocytes: 0 %
Lymphocytes Relative: 38 %
Lymphs Abs: 3.1 10*3/uL (ref 0.7–4.0)
MCH: 25.7 pg — ABNORMAL LOW (ref 26.0–34.0)
MCHC: 32.7 g/dL (ref 30.0–36.0)
MCV: 78.5 fL — ABNORMAL LOW (ref 80.0–100.0)
Monocytes Absolute: 0.8 10*3/uL (ref 0.1–1.0)
Monocytes Relative: 9 %
Neutro Abs: 4.1 10*3/uL (ref 1.7–7.7)
Neutrophils Relative %: 49 %
Platelets: 404 10*3/uL — ABNORMAL HIGH (ref 150–400)
RBC: 5.06 MIL/uL (ref 3.87–5.11)
RDW: 14.4 % (ref 11.5–15.5)
WBC: 8.3 10*3/uL (ref 4.0–10.5)
nRBC: 0 % (ref 0.0–0.2)

## 2022-01-17 LAB — TROPONIN I (HIGH SENSITIVITY)
Troponin I (High Sensitivity): 4 ng/L (ref <18)
Troponin I (High Sensitivity): 15 ng/L (ref <18)

## 2022-01-17 LAB — BASIC METABOLIC PANEL
Anion gap: 9 (ref 5–15)
BUN: 7 mg/dL (ref 6–20)
CO2: 18 mmol/L — ABNORMAL LOW (ref 22–32)
Calcium: 9 mg/dL (ref 8.9–10.3)
Chloride: 111 mmol/L (ref 98–111)
Creatinine, Ser: 0.94 mg/dL (ref 0.44–1.00)
GFR, Estimated: >60 mL/min (ref >60)
Glucose, Bld: 155 mg/dL — ABNORMAL HIGH (ref 70–99)
Potassium: 3.8 mmol/L (ref 3.5–5.1)
Sodium: 138 mmol/L (ref 135–145)

## 2022-01-17 LAB — MAGNESIUM: Magnesium: 2.1 mg/dL (ref 1.7–2.4)

## 2022-01-17 MED ORDER — ONDANSETRON HCL 4 MG/2ML IJ SOLN
4.0000 mg | Freq: Once | INTRAMUSCULAR | Status: AC
  Administered 2022-01-17: 4 mg via INTRAVENOUS
  Filled 2022-01-17: qty 2

## 2022-01-17 MED ORDER — SODIUM CHLORIDE 0.9 % IV BOLUS
1000.0000 mL | Freq: Once | INTRAVENOUS | Status: AC
Start: 2022-01-17 — End: 2022-01-17
  Administered 2022-01-17: 1000 mL via INTRAVENOUS

## 2022-01-17 NOTE — ED Provider Notes (Addendum)
[facility] Provider Note    Event Date/Time   First MD Initiated Contact with Patient 01/17/22 [phone]     (approximate)   History   Loss of Consciousness   HPI  Ariel Huynh is a 36 y.o. female   Past medical history of possible seizure versus syncope that was worked up last year the emergency department with referral to neurology.  She completed neurology consultation was scheduled for EEG last year but never completed.  She is on no medications.    Presents with syncope while at work.  She works at [employer] and had been on her feet working for about 1 hours with no symptoms and had no presyncope symptoms and awoke ground surrounded by coworkers.  Denies preceding illnesses.  She denies chest pain, shortness of breath, lightheadedness, dizziness, changes in vision.  SShe ate breakfast this morning.  She does not use drugs or alcohol.  After regaining consciousness she only complains of some mild nausea.  Not reported whether she was having seizure-like activity and there was no reported obvious postictal period.  She had no tongue bite nor does she have incontinence.  Last menstrual period was 3 weeks ago.  She reports no bleeding.  History was obtained via patient and a review of external notes.      Physical Exam   Triage Vital Signs: ED Triage Vitals  Enc Vitals Group     BP 01/17/22 0832 136/71     Pulse Rate 01/17/22 0832 (!) 129     Resp 01/17/22 0832 (!) 26     Temp 01/17/22 0832 98.6 F (37 C)     Temp Source 01/17/22 0832 Oral     SpO2 01/17/22 0832 100 %     Weight 01/17/22 0833 260 lb (117.9 kg)     Height 01/17/22 0833 5\' 5"  (1.651 m)     Head Circumference --      Peak Flow --      Pain Score 01/17/22 0832 5     Pain Loc --      Pain Edu? --      Excl. in [place]? --     Most recent vital signs: Vitals:   01/17/22 1000 01/17/22 1230  BP: 125/77 128/73  Pulse: 97 77  Resp: 20 16  Temp:    SpO2: 94% 100%     General: Awake, no distress.  Has bruising to her forehead and periorbital right side.  Extraocular movements intact, pupils equal and reactive.  No proptosis.  She is moving all extremities with equal strength, sensation throughout, normal finger-to-nose.  Steady gait. CV:  Good peripheral perfusion.  Mucus membranes are moist.  No murmurs.  Peripheral pulses intact strong.  Tachycardic. Resp:  Normal effort.  Nonlabored.  Clear to auscultation. Abd:  No distention.  Nontender no rigidity or guarding   ED Results / Procedures / Treatments   Labs (all labs ordered are listed, but only abnormal results are displayed) Labs Reviewed  BASIC METABOLIC PANEL - Abnormal; Notable for the following components:      Result Value   CO2 18 (*)    Glucose, Bld 155 (*)    All other components within normal limits  CBC WITH DIFFERENTIAL/PLATELET - Abnormal; Notable for the following components:   MCV 78.5 (*)    MCH 25.7 (*)    Platelets 404 (*)    All other components within normal limits  CBG MONITORING, ED - Abnormal; Notable for  the following components:   Glucose-Capillary 150 (*)    All other components within normal limits  MAGNESIUM  D-DIMER, QUANTITATIVE  HCG, QUANTITATIVE, PREGNANCY  TROPONIN I (HIGH SENSITIVITY)  TROPONIN I (HIGH SENSITIVITY)     I reviewed labs and they are notable for blood glucose is not hypoglycemic, 150.  EKG  ED ECG REPORT I, Pilar Jarvis, the attending physician, personally viewed and interpreted this ECG.   Date: 01/17/2022  EKG Time: 0832  Rate: 126  Rhythm: sinus tachycardia  Axis: normal  Intervals:none  ST&T Change: no ischemic changes    RADIOLOGY I dependently reviewed and interpreted CT of the head and see no obvious bleeding or midline shift.   PROCEDURES:  Critical Care performed: No  Procedures   MEDICATIONS ORDERED IN ED: Medications  ondansetron (ZOFRAN) injection 4 mg (4 mg Intravenous Given 01/17/22 0928)  sodium  chloride 0.9 % bolus 1,000 mL (1,000 mLs Intravenous New Bag/Given 01/17/22 0927)     IMPRESSION / MDM / ASSESSMENT AND PLAN / ED COURSE  I reviewed the triage vital signs and the nursing notes.                              Differential diagnosis includes, but is not limited to, seizure, cardiogenic syncope, or causes of syncope including vasovagal, postural, rule out pregnancy.   The patient is on the cardiac monitor to evaluate for evidence of arrhythmia and/or significant heart rate changes.  MDM: This is a patient with syncope with no clear presyncopal symptoms, not classically vasovagal or orthostatic.  He does have some nausea afterwards and facial trauma which will require CT of the head with facial bones.  Does not appear dehydrated, and has no symptoms of chest pain or dyspnea and no preceding illness.  I have low suspicion for cardiogenic or PE, will check EKG, troponins and a dimer given tachypnea and tachycardia.  Keep on cardiac monitor.  Labs for metabolic derangement.  Pregnancy test.  No reported seizure-like activity no clear postictal period, though seizure is on the differential diagnosis must evaluate for causes as above.    EKG was reviewed and is sinus tachycardia with no signs of arrhythmia including long QTc, Brugada, right heart strain, WPW.    1:24 PM Patient feels at baseline. Rate improved with fluids. Dimer negative. CT head negative. I spoke with the patient and her family member who is at bedside and shared decision-making about position.  Options include discharge with neurology follow-up and PMD referral to discuss Holter monitoring.  Also offered hospitalization for telemetry monitoring, but patient deferred. Initial troponin was negative, if repeat is flat, will dc w above outpt follow up plan (rise in trop minimal, per pathway can admit for obs but in discussion with pt she remains asymptomatic low risk factors for cardiac ischemia, trop rise may also  attribute to sinus tachycardia on presentation and she elects for discharge at this time)  Seizure precautions given to pt.  Return precautions given.  Dispo: After careful consideration of this patient's presentation, medical and social risk factors, and evaluation in the emergency department I engaged in shared decision making with the patient and/or their representative to consider admission or observation and this patient was ultimately discharged because stable with workup as above, and pt preference.   Patient's presentation is most consistent with acute presentation with potential threat to life or bodily function.       FINAL CLINICAL  IMPRESSION(S) / ED DIAGNOSES   Final diagnoses:  Syncope and collapse     Rx / DC Orders   ED Discharge Orders     None        Note:  This document was prepared using Dragon voice recognition software and may include unintentional dictation errors.    Pilar Jarvis, MD 01/17/22 1110    Pilar Jarvis, MD 01/17/22 1324

## 2022-01-17 NOTE — ED Triage Notes (Signed)
Patient brought in via ems from work Patient was walking when she had a witnessed fall with seizure like activity. Per ems was postictal on arrival. Patient states she had a one time seizure last year, but did not follow up with neurologist. Does not take meds

## 2022-01-17 NOTE — Discharge Instructions (Addendum)
Paperwork includes the name of 3 clinics which can serve as your primary doctor.  Give them a call to establish care.  Tell them about your urgency department visit and ask about whether Holter monitor would be appropriate.  Call your neurologist for another appointment for possible seizure.  These do not drive, swim, operate heavy machinery, or do any other activities which would be dangerous if you were to have another episode where you pass out.

## 2022-01-17 NOTE — ED Notes (Signed)
Urine was collected and her poc was negative.

## 2022-03-19 ENCOUNTER — Encounter: Payer: Self-pay | Admitting: Emergency Medicine

## 2022-03-19 ENCOUNTER — Emergency Department
Admission: EM | Admit: 2022-03-19 | Discharge: 2022-03-19 | Disposition: A | Discharge status: Home / Self Care | Payer: No Typology Code available for payment source | Attending: Emergency Medicine | Admitting: Emergency Medicine

## 2022-03-19 ENCOUNTER — Other Ambulatory Visit: Payer: Self-pay

## 2022-03-19 DIAGNOSIS — R569 Unspecified convulsions: Secondary | ICD-10-CM | POA: Diagnosis present

## 2022-03-19 LAB — CBC WITH DIFFERENTIAL/PLATELET
Abs Immature Granulocytes: 0.04 10*3/uL (ref 0.00–0.07)
Basophils Absolute: 0.1 10*3/uL (ref 0.0–0.1)
Basophils Relative: 1 %
Eosinophils Absolute: 0.1 10*3/uL (ref 0.0–0.5)
Eosinophils Relative: 2 %
HCT: 37 % (ref 36.0–46.0)
Hemoglobin: 12.2 g/dL (ref 12.0–15.0)
Immature Granulocytes: 1 %
Lymphocytes Relative: 30 %
Lymphs Abs: 2.1 10*3/uL (ref 0.7–4.0)
MCH: 25.3 pg — ABNORMAL LOW (ref 26.0–34.0)
MCHC: 33 g/dL (ref 30.0–36.0)
MCV: 76.6 fL — ABNORMAL LOW (ref 80.0–100.0)
Monocytes Absolute: 0.6 10*3/uL (ref 0.1–1.0)
Monocytes Relative: 9 %
Neutro Abs: 4.1 10*3/uL (ref 1.7–7.7)
Neutrophils Relative %: 57 %
Platelets: 328 10*3/uL (ref 150–400)
RBC: 4.83 MIL/uL (ref 3.87–5.11)
RDW: 14 % (ref 11.5–15.5)
WBC: 7.1 10*3/uL (ref 4.0–10.5)
nRBC: 0 % (ref 0.0–0.2)

## 2022-03-19 LAB — BASIC METABOLIC PANEL
Anion gap: 10 (ref 5–15)
BUN: 9 mg/dL (ref 6–20)
CO2: 19 mmol/L — ABNORMAL LOW (ref 22–32)
Calcium: 8.7 mg/dL — ABNORMAL LOW (ref 8.9–10.3)
Chloride: 109 mmol/L (ref 98–111)
Creatinine, Ser: 1.05 mg/dL — ABNORMAL HIGH (ref 0.44–1.00)
GFR, Estimated: >60 mL/min (ref >60)
Glucose, Bld: 98 mg/dL (ref 70–99)
Potassium: 3.5 mmol/L (ref 3.5–5.1)
Sodium: 138 mmol/L (ref 135–145)

## 2022-03-19 MED ORDER — LEVETIRACETAM 500 MG PO TABS: 500.0000 mg | ORAL_TABLET | Freq: Two times a day (BID) | ORAL | 0 refills | Status: AC

## 2022-03-19 MED ORDER — LEVETIRACETAM 500 MG PO TABS: 500.0000 mg | ORAL_TABLET | Freq: Once | ORAL | Status: DC

## 2022-03-19 NOTE — ED Provider Triage Note (Signed)
Emergency Medicine Provider Triage Evaluation Note  BONNA STEURY , a 36 y.o. female  was evaluated in triage.  Pt complains of seizure, witnessed by her friend who called EMS. Had EEG yesterday but reports that she had not had the results yet. Not on any medications. Reports this is her 3rd seizure. Denies any preceding symptoms or aura. No tongue biting or incontinence during the episode. Slept normally. Smokes cigarettes. No other drug use. Reports feels back to normal now. Had normal CT 01/17/22   Review of Systems  Positive: seizure Negative: Tongue biting, incontinence, pain  Physical Exam  There were no vitals taken for this visit. Gen:   Awake, no distress   Resp:  Normal effort  MSK:   Moves extremities without difficulty  Other:    Medical Decision Making  Medically screening exam initiated at 2:02 PM.  Appropriate orders placed.  Rico Junker was informed that the remainder of the evaluation will be completed by another provider, this initial triage assessment does not replace that evaluation, and the importance of remaining in the ED until their evaluation is complete.     Marquette Old, PA-C 03/19/22 1407

## 2022-03-19 NOTE — Discharge Instructions (Addendum)
Please follow-up with Dr. Melrose Nakayama on the 31st as scheduled so that he may decide if the [place] should be continued or discontinued at that point.  As we discussed please do not drive until cleared by neurology.  Return to the emergency department for any further seizure like activity/episodes or any other symptom personally concerning to yourself.

## 2022-03-19 NOTE — ED Notes (Signed)
Neuro check wnl. No signs of distress. No complaints.

## 2022-03-19 NOTE — ED Provider Notes (Signed)
[facility] Provider Note    Event Date/Time   First MD Initiated Contact with Patient 03/19/22 1627     (approximate)  History   Chief Complaint: Seizures  HPI  Ariel Huynh is a 36 y.o. female with no significant past medical history who presents to the emergency department after a possible seizure.  According to a family member who is here with the patient she states the patient was sitting down on her phone when she appeared to lose consciousness fell forwards and then onto the ground with generalized shaking type movements.  States that patient seemed to stiffen up and then had shaking tremors of all extremities lasting several minutes and then the patient was very confused for approximately 10 to 30 minutes after the incident.  Patient states this has been a recent issue in fact she just started following up with Dr. Melrose Nakayama this month had a 3-hour EEG performed yesterday but has not yet gotten the results.  Patient does not drink alcohol frequently.   Physical Exam   Triage Vital Signs: ED Triage Vitals  Enc Vitals Group     BP 03/19/22 1404 139/77     Pulse Rate 03/19/22 1404 (!) 138     Resp 03/19/22 1404 18     Temp 03/19/22 1404 98.3 F (36.8 C)     Temp Source 03/19/22 1404 Oral     SpO2 03/19/22 1404 100 %     Weight 03/19/22 1422 259 lb 14.8 oz (117.9 kg)     Height 03/19/22 1422 5\' 5"  (1.651 m)     Head Circumference --      Peak Flow --      Pain Score 03/19/22 1422 0     Pain Loc --      Pain Edu? --      Excl. in [employer]? --     Most recent vital signs: Vitals:   03/19/22 1404  BP: 139/77  Pulse: (!) 138  Resp: 18  Temp: 98.3 F (36.8 C)  SpO2: 100%    General: Awake, no distress.  CV:  Good peripheral perfusion.  Regular rate and rhythm  Resp:  Normal effort.  Equal breath sounds bilaterally.  Abd:  No distention.  Soft, nontender.  No rebound or guarding.   ED Results / Procedures / Treatments   EKG  EKG viewed  and interpreted by myself shows sinus tachycardia 134 bpm with a narrow QRS, normal axis, normal intervals, no concerning ST changes.   MEDICATIONS ORDERED IN ED: Medications  levETIRAcetam (KEPPRA) tablet 500 mg (has no administration in time range)     IMPRESSION / MDM / ASSESSMENT AND PLAN / ED COURSE  I reviewed the triage vital signs and the nursing notes.  Patient's presentation is most consistent with acute presentation with potential threat to life or bodily function.  Patient presents emergency department after seizure-like activity at home.  I reviewed the patient's chart she has had several of these episodes is currently following up with Dr. Melrose Nakayama.  Family member description of the event sounds convincing for a tonic-clonic seizure.  Currently the patient appears well states she feels back to her normal self besides feeling very fatigued.  Patient just had a 3-hour EEG performed yesterday but does not yet have the results.  Patient's follow-up appointment is not for 10 days.  I believe it would be reasonable to start the patient on a low-dose Keppra 500 mg twice daily until the patient is  seen by Dr. Malvin Johns to decide if this should be continued or discontinued.  I also discussed with the patient she should not be driving until cleared by Dr. Malvin Johns as well as not bathing alone or swimming alone.  Patient's work-up in the emergency department shows a normal CBC with a normal white blood cell count, normal/reassuring chemistry.  Initially tachycardic but normal heart rate currently.  FINAL CLINICAL IMPRESSION(S) / ED DIAGNOSES   Seizure-like activity  Rx / DC Orders   Keppra Neurology follow-up  Note:  This document was prepared using Dragon voice recognition software and may include unintentional dictation errors.   Minna Antis, MD 03/19/22 1649

## 2022-03-19 NOTE — ED Triage Notes (Addendum)
First Nurse Note:  Arrives via [facility] from home.  Son whitnessed a seizure.  New diagnosis with seizure 01/17/2022.  Recently had studies to identify reason behind seizures.  Ha not been started on anticonvulsants,  CBG:  138.  250 NS given via 20 right FA.  VS wnl.  Initially HR 148, improved after IVF.

## 2022-03-30 ENCOUNTER — Other Ambulatory Visit: Payer: Self-pay | Admitting: Physician Assistant

## 2022-03-30 DIAGNOSIS — G40109 Localization-related (focal) (partial) symptomatic epilepsy and epileptic syndromes with simple partial seizures, not intractable, without status epilepticus: Secondary | ICD-10-CM

## 2022-04-26 ENCOUNTER — Ambulatory Visit
Admission: RE | Admit: 2022-04-26 | Discharge: 2022-04-26 | Disposition: A | Discharge status: Home / Self Care | Payer: No Typology Code available for payment source | Source: Ambulatory Visit | Attending: Physician Assistant | Admitting: Physician Assistant

## 2022-04-26 DIAGNOSIS — G40109 Localization-related (focal) (partial) symptomatic epilepsy and epileptic syndromes with simple partial seizures, not intractable, without status epilepticus: Secondary | ICD-10-CM

## 2022-04-26 MED ORDER — GADOBENATE DIMEGLUMINE 529 MG/ML IV SOLN
20.0000 mL | Freq: Once | INTRAVENOUS | Status: AC | PRN
  Administered 2022-04-26: 20 mL via INTRAVENOUS

## 2023-02-26 ENCOUNTER — Other Ambulatory Visit: Payer: Self-pay

## 2023-02-26 ENCOUNTER — Emergency Department
Admission: EM | Admit: 2023-02-26 | Discharge: 2023-02-27 | Disposition: A | Discharge status: Home / Self Care | Payer: No Typology Code available for payment source | Attending: Emergency Medicine | Admitting: Emergency Medicine

## 2023-02-26 DIAGNOSIS — R569 Unspecified convulsions: Secondary | ICD-10-CM | POA: Insufficient documentation

## 2023-02-26 MED ORDER — LEVETIRACETAM IN NACL 1000 MG/100ML IV SOLN
1000.0000 mg | Freq: Once | INTRAVENOUS | Status: AC
  Administered 2023-02-27: 1000 mg via INTRAVENOUS
  Filled 2023-02-26: qty 100

## 2023-02-26 NOTE — ED Triage Notes (Signed)
States she was at home and had a seizure unwitnessed. Tried to call neighbor afterwards and they came over. Called EMS. EMS noted pt was in the floor for unknown amount of time in postictal phase. Pt stated has not missed any medications. 20G R hand.

## 2023-02-27 LAB — URINE DRUG SCREEN, QUALITATIVE (ARMC ONLY)
Amphetamines, Ur Screen: NOT DETECTED
Barbiturates, Ur Screen: NOT DETECTED
Benzodiazepine, Ur Scrn: NOT DETECTED
Cannabinoid 50 Ng, Ur ~~LOC~~: NOT DETECTED
Cocaine Metabolite,Ur ~~LOC~~: NOT DETECTED
MDMA (Ecstasy)Ur Screen: NOT DETECTED
Methadone Scn, Ur: NOT DETECTED
Opiate, Ur Screen: NOT DETECTED
Phencyclidine (PCP) Ur S: NOT DETECTED
Tricyclic, Ur Screen: NOT DETECTED

## 2023-02-27 LAB — CBC WITH DIFFERENTIAL/PLATELET
Abs Immature Granulocytes: 0.05 10*3/uL (ref 0.00–0.07)
Basophils Absolute: 0.1 10*3/uL (ref 0.0–0.1)
Basophils Relative: 1 %
Eosinophils Absolute: 0.1 10*3/uL (ref 0.0–0.5)
Eosinophils Relative: 1 %
HCT: 38.9 % (ref 36.0–46.0)
Hemoglobin: 13.2 g/dL (ref 12.0–15.0)
Immature Granulocytes: 1 %
Lymphocytes Relative: 14 %
Lymphs Abs: 1.5 10*3/uL (ref 0.7–4.0)
MCH: 28.4 pg (ref 26.0–34.0)
MCHC: 33.9 g/dL (ref 30.0–36.0)
MCV: 83.8 fL (ref 80.0–100.0)
Monocytes Absolute: 0.6 10*3/uL (ref 0.1–1.0)
Monocytes Relative: 6 %
Neutro Abs: 8.3 10*3/uL — ABNORMAL HIGH (ref 1.7–7.7)
Neutrophils Relative %: 77 %
Platelets: 292 10*3/uL (ref 150–400)
RBC: 4.64 MIL/uL (ref 3.87–5.11)
RDW: 13 % (ref 11.5–15.5)
WBC: 10.5 10*3/uL (ref 4.0–10.5)
nRBC: 0 % (ref 0.0–0.2)

## 2023-02-27 LAB — ETHANOL: Alcohol, Ethyl (B): <10 mg/dL (ref <10)

## 2023-02-27 LAB — COMPREHENSIVE METABOLIC PANEL
ALT: 14 U/L (ref 0–44)
AST: 21 U/L (ref 15–41)
Albumin: 3.9 g/dL (ref 3.5–5.0)
Alkaline Phosphatase: 60 U/L (ref 38–126)
Anion gap: 8 (ref 5–15)
BUN: 10 mg/dL (ref 6–20)
CO2: 22 mmol/L (ref 22–32)
Calcium: 8.9 mg/dL (ref 8.9–10.3)
Chloride: 108 mmol/L (ref 98–111)
Creatinine, Ser: 0.82 mg/dL (ref 0.44–1.00)
GFR, Estimated: >60 mL/min (ref >60)
Glucose, Bld: 102 mg/dL — ABNORMAL HIGH (ref 70–99)
Potassium: 3.6 mmol/L (ref 3.5–5.1)
Sodium: 138 mmol/L (ref 135–145)
Total Bilirubin: 0.5 mg/dL (ref 0.3–1.2)
Total Protein: 7.2 g/dL (ref 6.5–8.1)

## 2023-02-27 LAB — URINALYSIS, ROUTINE W REFLEX MICROSCOPIC
Bacteria, UA: NONE SEEN
Bilirubin Urine: NEGATIVE
Glucose, UA: NEGATIVE mg/dL
Ketones, ur: NEGATIVE mg/dL
Leukocytes,Ua: NEGATIVE
Nitrite: NEGATIVE
Protein, ur: 30 mg/dL — AB
Specific Gravity, Urine: 1.012 (ref 1.005–1.030)
pH: 6 (ref 5.0–8.0)

## 2023-02-27 LAB — POC URINE PREG, ED: Preg Test, Ur: NEGATIVE — AB

## 2023-02-27 NOTE — Discharge Instructions (Signed)
No driving for 6 months after most recent seizure or spell, per [place] law.   Avoid activities that are potentially dangerous if you were to have another seizure, including operating heavy machinery, swimming, taking baths, climbing heights. Please use direct supervision around stoves, ovens, fireplaces, campfires, or other sources of heat or fire.    Make sure you are taking medications as directed, avoiding alcohol and drug use, getting adequate sleep and eating regular meals.   

## 2023-02-27 NOTE — ED Provider Notes (Signed)
[facility] Provider Note    Event Date/Time   First MD Initiated Contact with Patient 02/26/23 2332     (approximate)   History   Seizures (States she was at home and had a seizure unwitnessed. Tried to call neighbor afterwards and they came over. Called EMS. Ems noted pt was in the floor for unknown amount of time in postictal phase)   HPI  Ariel Huynh is a 37 y.o. 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  States that she remembers talking on the phone and then the next thing she remembers is waking up on the floor with her cousin in her house.  She has a bruise to her forehead.  No headache.  Reports compliance with her Keppra 500 mg twice daily.  Her neurologist is at [facility].  She denies any fevers, cough, vomiting, diarrhea, illicit drug use, alcohol use, insomnia, increased stress.   History provided by patient, family.    Past Medical History:  Diagnosis Date   Seizures (HCC)     History reviewed. No pertinent surgical history.  MEDICATIONS:  Prior to Admission medications   Medication Sig Start Date End Date Taking? Authorizing Provider  levETIRAcetam (KEPPRA) 500 MG tablet Take 1 tablet (500 mg total) by mouth 2 (two) times daily. 03/19/22   Minna Antis, MD  ondansetron (ZOFRAN) 4 MG tablet Take 1 tablet (4 mg total) by mouth every 8 (eight) hours as needed for up to 10 doses for nausea or vomiting. 09/02/20   Gilles Chiquito, MD    Physical Exam   Triage Vital Signs: ED Triage Vitals  Encounter Vitals Group     BP 02/26/23 2334 134/86     Systolic BP Percentile --      Diastolic BP Percentile --      Pulse Rate 02/26/23 2334 93     Resp 02/26/23 2334 18     Temp 02/26/23 2317 98.3 F (36.8 C)     Temp Source 02/26/23 2317 Oral     SpO2 02/26/23 2334 99 %     Weight 02/26/23 2335 241 lb 14.4 oz (109.7 kg)     Height 02/26/23 2335 5\' 5"   (1.651 m)     Head Circumference --      Peak Flow --      Pain Score 02/26/23 2340 0     Pain Loc --      Pain Education --      Exclude from Growth Chart --     Most recent vital signs: Vitals:   02/27/23 0115 02/27/23 0130  BP: 122/87 126/76  Pulse: 87 90  Resp: 17 14  Temp:    SpO2: 100% 100%    CONSTITUTIONAL: Alert, oriented x 3, responds appropriately to questions. Well-appearing; well-nourished HEAD: Normocephalic, small bruise to the forehead EYES: Conjunctivae clear, pupils appear equal, sclera nonicteric ENT: normal nose; moist mucous membranes NECK: Supple, normal ROM CARD: RRR; S1 and S2 appreciated RESP: Normal chest excursion without splinting or tachypnea; breath sounds clear and equal bilaterally; no wheezes, no rhonchi, no rales, no hypoxia or respiratory distress, speaking full sentences ABD/GI: Non-distended; soft, non-tender, no rebound, no guarding, no peritoneal signs BACK: The back appears normal EXT: Normal ROM in all joints; no deformity noted, no edema SKIN: Normal color for age and race; warm; no rash on exposed skin NEURO: Moves all extremities equally, normal speech, normal sensation, cranial nerves  II through XII intact PSYCH: The patient's mood and manner are appropriate.   ED Results / Procedures / Treatments   LABS: (all labs ordered are listed, but only abnormal results are displayed) Labs Reviewed  COMPREHENSIVE METABOLIC PANEL - Abnormal; Notable for the following components:      Result Value   Glucose, Bld 102 (*)    All other components within normal limits  URINALYSIS, ROUTINE W REFLEX MICROSCOPIC - Abnormal; Notable for the following components:   Color, Urine YELLOW (*)    APPearance CLEAR (*)    Hgb urine dipstick SMALL (*)    Protein, ur 30 (*)    All other components within normal limits  CBC WITH DIFFERENTIAL/PLATELET - Abnormal; Notable for the following components:   Neutro Abs 8.3 (*)    All other components within  normal limits  POC URINE PREG, ED - Abnormal; Notable for the following components:   Preg Test, Ur Negative (*)    All other components within normal limits  ETHANOL  URINE DRUG SCREEN, QUALITATIVE ([facility] ONLY)  CBC WITH DIFFERENTIAL/PLATELET  LEVETIRACETAM LEVEL     EKG:  RADIOLOGY: My personal review and interpretation of imaging:    I have personally reviewed all radiology reports.   No results found.   PROCEDURES:  Critical Care performed: No   CRITICAL CARE Performed by: Rochele Raring   Total critical care time: 0 minutes  Critical care time was exclusive of separately billable procedures and treating other patients.  Critical care was necessary to treat or prevent imminent or life-threatening deterioration.  Critical care was time spent personally by me on the following activities: development of treatment plan with patient and/or surrogate as well as nursing, discussions with consultants, evaluation of patient's response to treatment, examination of patient, obtaining history from patient or surrogate, ordering and performing treatments and interventions, ordering and review of laboratory studies, ordering and review of radiographic studies, pulse oximetry and re-evaluation of patient's condition.   Procedures    IMPRESSION / MDM / ASSESSMENT AND PLAN / ED COURSE  I reviewed the triage vital signs and the nursing notes.    Patient here with possible unwitnessed seizure.  The patient is on the cardiac monitor to evaluate for evidence of arrhythmia and/or significant heart rate changes.   DIFFERENTIAL DIAGNOSIS (includes but not limited to):   Seizure, anemia, electrolyte derangement, UTI, fall, head injury, doubt intracranial hemorrhage, stroke   Patient's presentation is most consistent with acute presentation with potential threat to life or bodily function.   PLAN: Will obtain screening labs, urine.  Will give IV Keppra.  Will also send a Keppra  level.  Patient is currently neurologically intact without complaints of pain.   MEDICATIONS GIVEN IN ED: Medications  levETIRAcetam (KEPPRA) IVPB 1000 mg/100 mL premix (0 mg Intravenous Stopped 02/27/23 0137)     ED COURSE: No further seizure-like activity here in the ED.  Labs unremarkable.  Normal hemoglobin, electrolytes, glucose, urine shows no infection.  Drug screen, ethanol levels negative.  Recommended increasing her Keppra dose but she would prefer to discuss with her neurologist.  Recommended close follow-up as an outpatient.  Recommended no driving for the next 6 months.  Will provide with work note for couple of days.  Patient and family at bedside verbalized understanding.   At this time, I do not feel there is any life-threatening condition present. I reviewed all nursing notes, vitals, pertinent previous records.  All lab and urine results, EKGs, imaging ordered have  been independently reviewed and interpreted by myself.  I reviewed all available radiology reports from any imaging ordered this visit.  Based on my assessment, I feel the patient is safe to be discharged home without further emergent workup and can continue workup as an outpatient as needed. Discussed all findings, treatment plan as well as usual and customary return precautions.  They verbalize understanding and are comfortable with this plan.  Outpatient follow-up has been provided as needed.  All questions have been answered.    CONSULTS:  none   OUTSIDE RECORDS REVIEWED: Reviewed last neurology note in October 2023.       FINAL CLINICAL IMPRESSION(S) / ED DIAGNOSES   Final diagnoses:  Seizure (HCC)     Rx / DC Orders   ED Discharge Orders     None        Note:  This document was prepared using Dragon voice recognition software and may include unintentional dictation errors.   Henya Aguallo, Layla Maw, DO 02/27/23 [phone]

## 2023-02-28 LAB — LEVETIRACETAM LEVEL: Levetiracetam Lvl: <2 ug/mL — ABNORMAL LOW (ref 10.0–40.0)

## 2023-03-20 IMAGING — CT CT HEAD W/O CM
3 series · 15 of 47 positions shown, 18 images · non-contrast
Comparison: None.

CLINICAL DATA: 34-year-old with facial trauma.  Post seizure.

EXAM:
CT HEAD WITHOUT CONTRAST
CT CERVICAL SPINE WITHOUT CONTRAST
TECHNIQUE: Multidetector CT imaging of the head and cervical spine was
performed following the standard protocol without intravenous
contrast. Multiplanar CT image reconstructions of the cervical spine
were also generated.

[Series 3: head wo · axial · 0.43mm/px · z∈[-133,-8]mm · 9 of 31 slices shown, 12 images]
[im 3/31  brain]
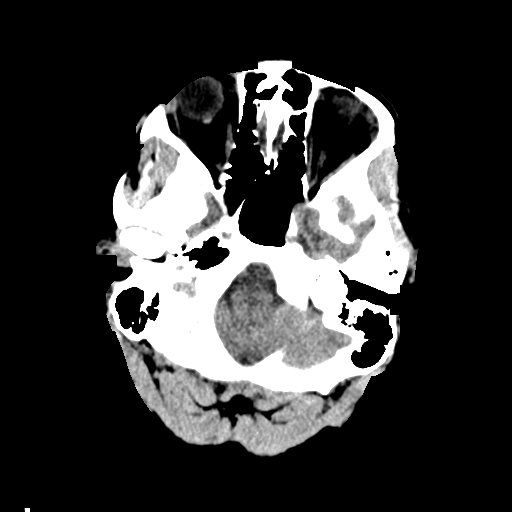
[im 3/31  bone]
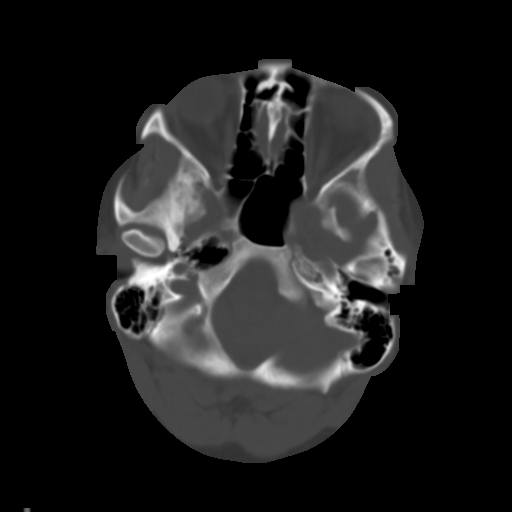
[im 6/31  brain]
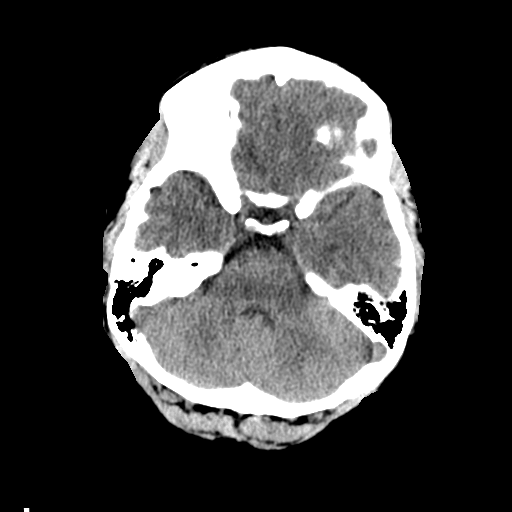
[im 9/31  brain]
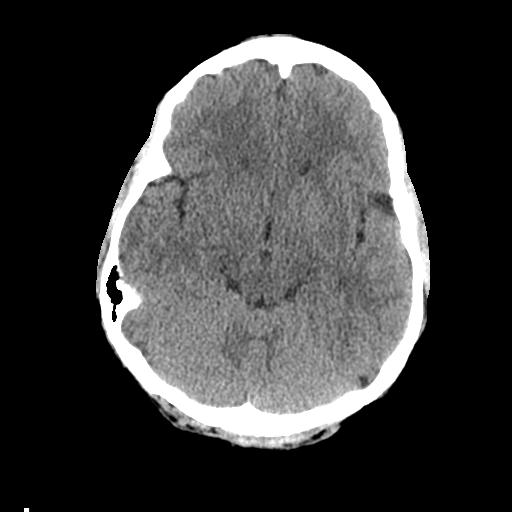
[im 12/31  brain]
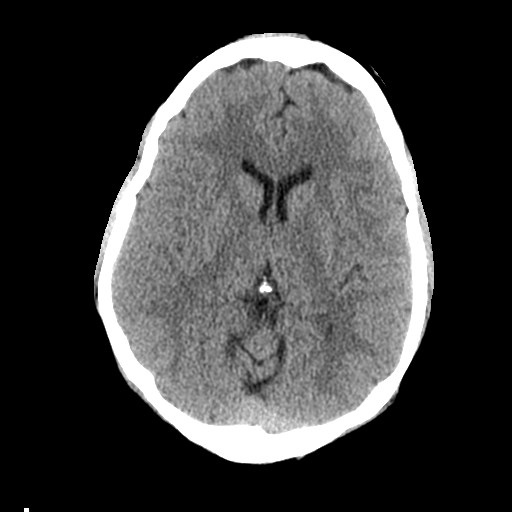
[im 16/31  brain]
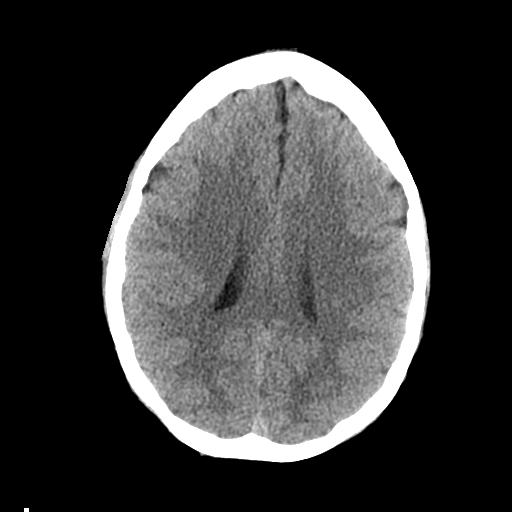
[im 16/31  bone]
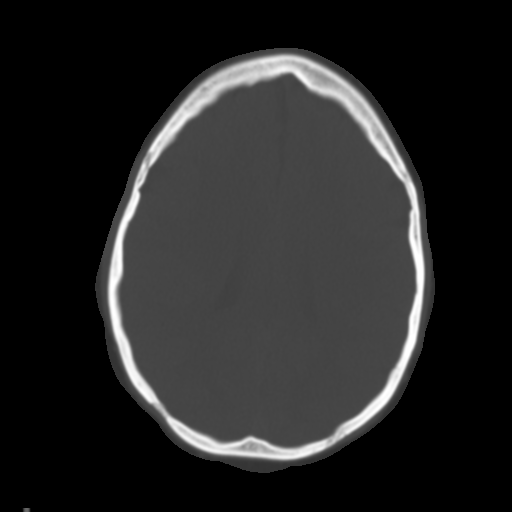
[im 19/31  brain]
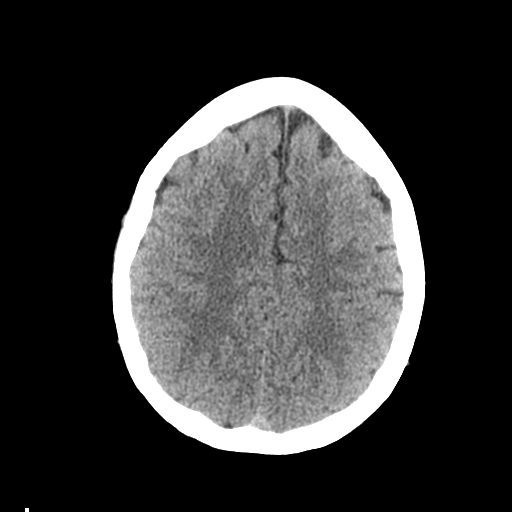
[im 22/31  brain]
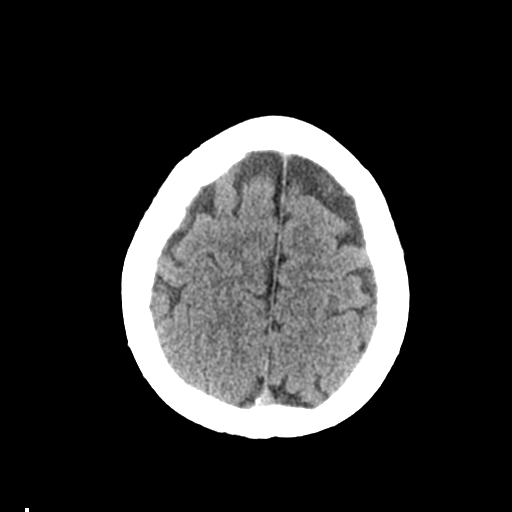
[im 25/31  brain]
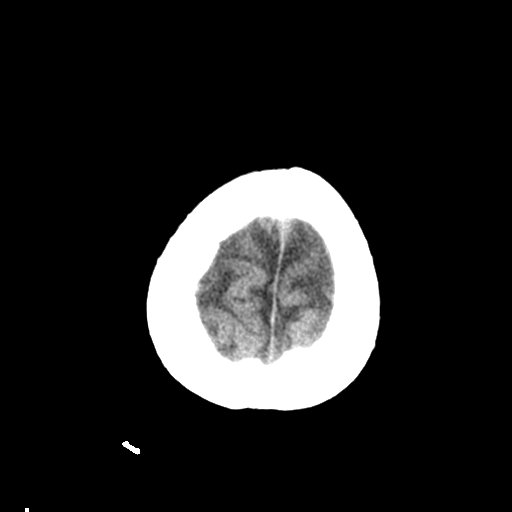
[im 28/31  brain]
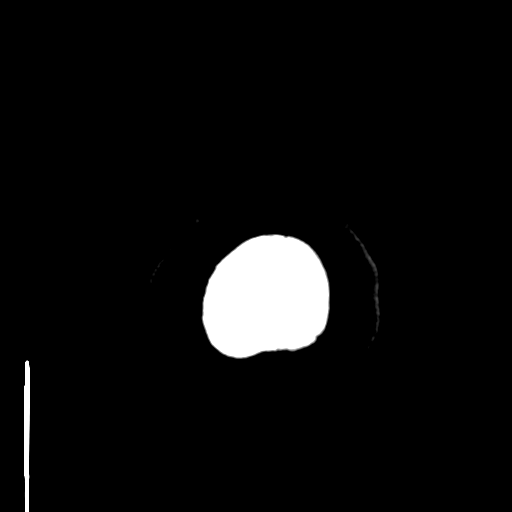
[im 28/31  bone]
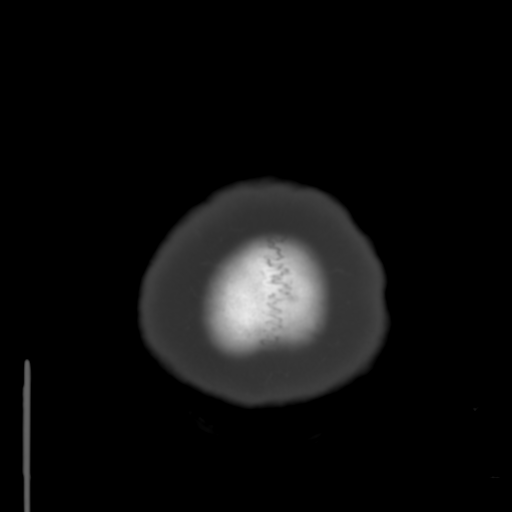

[Series 4: coronal soft tissue · coronal · 0.33mm/px · 3 of 64 slices shown]
[im 22/64  brain]
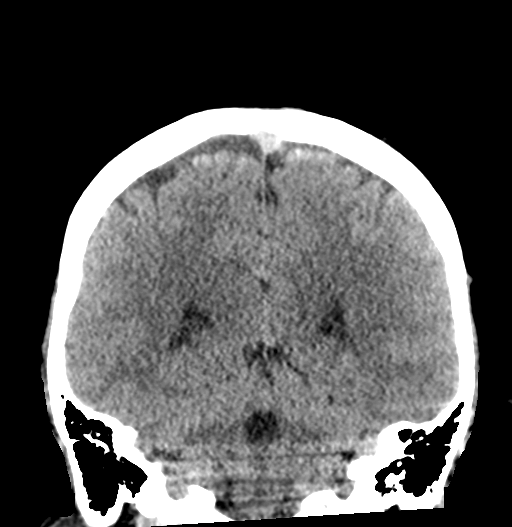
[im 29/64  brain]
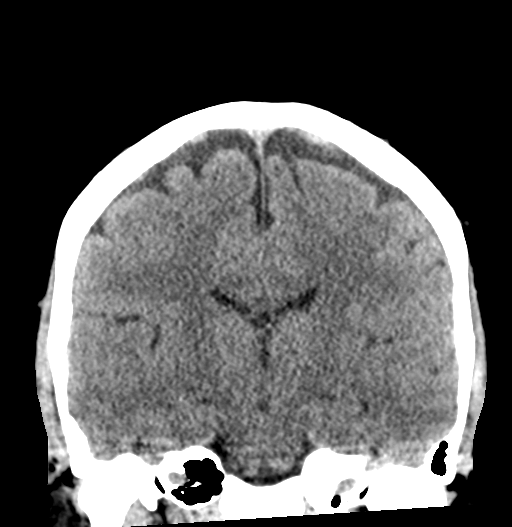
[im 36/64  brain]
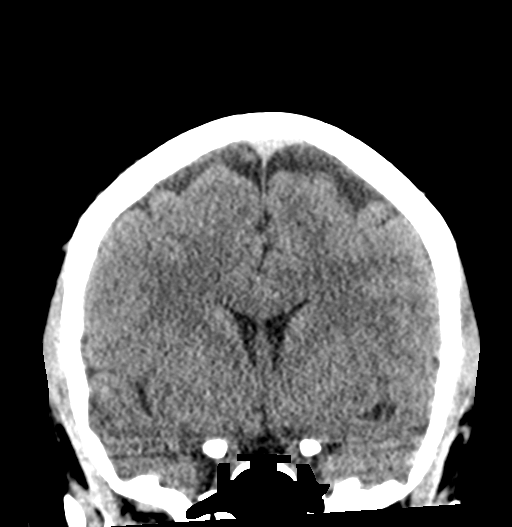

[Series 5: sagittal soft tissue · sagittal · 0.32mm/px · 3 of 54 slices shown]
[im 18/54  brain]
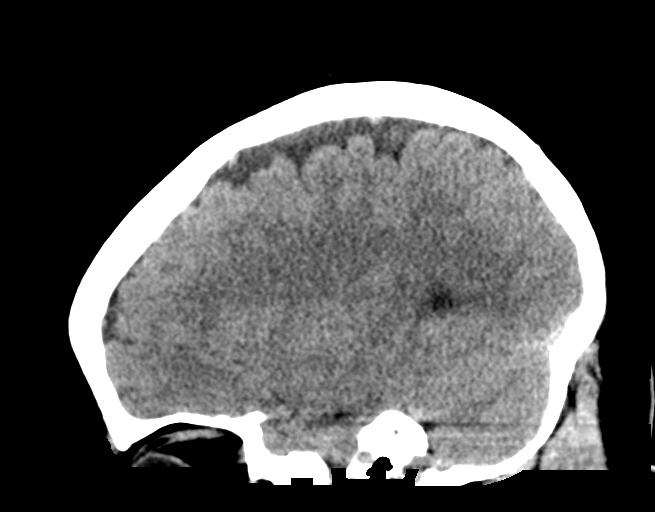
[im 27/54  brain]
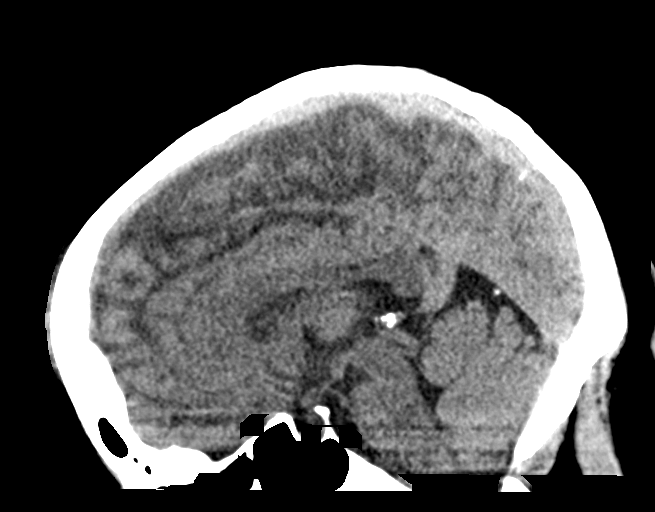
[im 36/54  brain]
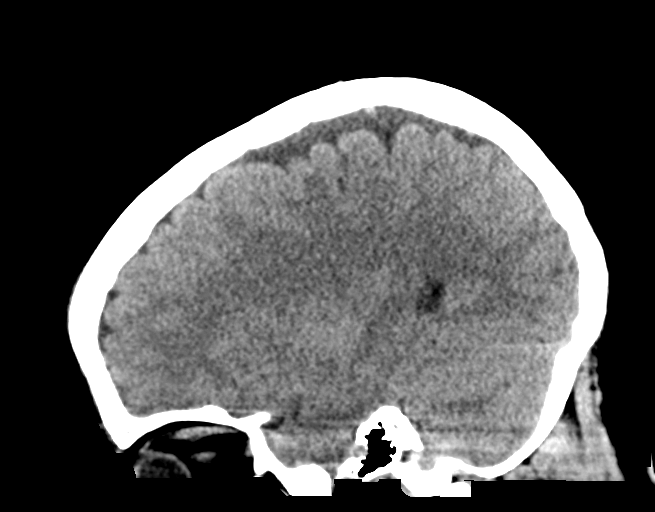

[15 of 47 positions shown; findings below may reference images not displayed]

FINDINGS: CT HEAD FINDINGS

Brain: No evidence of acute infarction, hemorrhage, hydrocephalus,
extra-axial collection or mass lesion/mass effect.

Vascular: No hyperdense vessel or unexpected calcification.

Skull: Normal. Negative for fracture or focal lesion.

Sinuses/Orbits: No acute finding.

Other: Focal anterior swelling in the forehead. No underlying
fracture.

CT CERVICAL SPINE FINDINGS

Alignment: Mild kyphosis in the cervical spine.

Skull base and vertebrae: No acute fracture. No primary bone lesion
or focal pathologic process.

Soft tissues and spinal canal: No prevertebral fluid or swelling. No
visible canal hematoma.

Disc levels:  Disc spaces are maintained.

Upper chest: Negative.

Other: Bone detail is limited in the lower cervical spine.
IMPRESSION: 1. No acute intracranial abnormality.
2. No acute abnormality in the cervical spine.
3. Focal soft tissue swelling in the forehead without acute bone
abnormality.

## 2024-03-01 ENCOUNTER — Ambulatory Visit: Payer: Self-pay
